# Patient Record
Sex: Female | Born: 1970 | Race: Black or African American | Hispanic: No | Marital: Married | Smoking: Current every day smoker
Health system: Southern US, Community
[De-identification: ages and names within clinical notes are randomized; demographics above are authoritative.]

## PROBLEM LIST (undated history)

## (undated) ENCOUNTER — Emergency Department (HOSPITAL_COMMUNITY): Payer: 59 | Source: Home / Self Care

## (undated) DIAGNOSIS — K449 Diaphragmatic hernia without obstruction or gangrene: Secondary | ICD-10-CM

## (undated) DIAGNOSIS — N390 Urinary tract infection, site not specified: Secondary | ICD-10-CM

## (undated) DIAGNOSIS — K219 Gastro-esophageal reflux disease without esophagitis: Secondary | ICD-10-CM

## (undated) DIAGNOSIS — I10 Essential (primary) hypertension: Secondary | ICD-10-CM

## (undated) HISTORY — DX: Diaphragmatic hernia without obstruction or gangrene: K44.9

## (undated) HISTORY — PX: SHOULDER ARTHROSCOPY WITH ROTATOR CUFF REPAIR: SHX5685

## (undated) HISTORY — DX: Gastro-esophageal reflux disease without esophagitis: K21.9

## (undated) HISTORY — PX: CHOLECYSTECTOMY: SHX55

## (undated) HISTORY — PX: ABDOMINAL HYSTERECTOMY: SHX81

---

## 2003-08-23 ENCOUNTER — Other Ambulatory Visit: Payer: Self-pay

## 2003-12-21 ENCOUNTER — Emergency Department: Payer: Self-pay | Admitting: Emergency Medicine

## 2004-01-20 ENCOUNTER — Emergency Department: Payer: Self-pay | Admitting: Unknown Physician Specialty

## 2004-03-17 ENCOUNTER — Emergency Department: Payer: Self-pay | Admitting: Emergency Medicine

## 2004-05-13 ENCOUNTER — Emergency Department: Payer: Self-pay | Admitting: Emergency Medicine

## 2004-06-14 ENCOUNTER — Emergency Department: Payer: Self-pay | Admitting: Emergency Medicine

## 2004-07-08 ENCOUNTER — Emergency Department: Payer: Self-pay | Admitting: Emergency Medicine

## 2004-12-08 ENCOUNTER — Emergency Department: Payer: Self-pay | Admitting: Internal Medicine

## 2005-02-24 ENCOUNTER — Emergency Department: Payer: Self-pay | Admitting: Emergency Medicine

## 2005-05-12 ENCOUNTER — Emergency Department: Payer: Self-pay | Admitting: General Practice

## 2005-06-05 ENCOUNTER — Emergency Department: Payer: Self-pay | Admitting: Emergency Medicine

## 2006-02-09 ENCOUNTER — Other Ambulatory Visit: Admission: RE | Admit: 2006-02-09 | Discharge: 2006-02-09 | Payer: Self-pay | Admitting: Obstetrics and Gynecology

## 2006-07-30 ENCOUNTER — Emergency Department: Payer: Self-pay | Admitting: Emergency Medicine

## 2006-09-25 ENCOUNTER — Emergency Department: Payer: Self-pay | Admitting: Emergency Medicine

## 2006-11-20 ENCOUNTER — Emergency Department: Payer: Self-pay | Admitting: Emergency Medicine

## 2008-02-12 ENCOUNTER — Emergency Department: Payer: Self-pay | Admitting: Emergency Medicine

## 2009-11-05 ENCOUNTER — Emergency Department: Payer: Self-pay | Admitting: Unknown Physician Specialty

## 2009-12-15 ENCOUNTER — Emergency Department: Payer: Self-pay | Admitting: Emergency Medicine

## 2010-08-13 ENCOUNTER — Emergency Department: Payer: Self-pay | Admitting: *Deleted

## 2010-09-26 ENCOUNTER — Ambulatory Visit: Payer: Self-pay

## 2011-09-22 ENCOUNTER — Emergency Department (HOSPITAL_COMMUNITY)
Admission: EM | Admit: 2011-09-22 | Discharge: 2011-09-23 | Disposition: A | Discharge status: Home / Self Care | Payer: Managed Care, Other (non HMO) | Attending: Emergency Medicine | Admitting: Emergency Medicine

## 2011-09-22 ENCOUNTER — Emergency Department (HOSPITAL_COMMUNITY): Payer: Managed Care, Other (non HMO)

## 2011-09-22 ENCOUNTER — Encounter (HOSPITAL_COMMUNITY): Payer: Self-pay | Admitting: *Deleted

## 2011-09-22 DIAGNOSIS — M549 Dorsalgia, unspecified: Secondary | ICD-10-CM | POA: Insufficient documentation

## 2011-09-22 DIAGNOSIS — R109 Unspecified abdominal pain: Secondary | ICD-10-CM | POA: Insufficient documentation

## 2011-09-22 DIAGNOSIS — F17200 Nicotine dependence, unspecified, uncomplicated: Secondary | ICD-10-CM | POA: Insufficient documentation

## 2011-09-22 DIAGNOSIS — I10 Essential (primary) hypertension: Secondary | ICD-10-CM | POA: Insufficient documentation

## 2011-09-22 HISTORY — DX: Urinary tract infection, site not specified: N39.0

## 2011-09-22 HISTORY — DX: Essential (primary) hypertension: I10

## 2011-09-22 LAB — POCT I-STAT, CHEM 8
BUN: 5 mg/dL — ABNORMAL LOW (ref 6–23)
Creatinine, Ser: 0.8 mg/dL (ref 0.50–1.10)
Hemoglobin: 15.3 g/dL — ABNORMAL HIGH (ref 12.0–15.0)
Potassium: 3.9 mEq/L (ref 3.5–5.1)
Sodium: 140 mEq/L (ref 135–145)

## 2011-09-22 LAB — CBC WITH DIFFERENTIAL/PLATELET
Basophils Absolute: 0.1 10*3/uL (ref 0.0–0.1)
Eosinophils Absolute: 0.3 10*3/uL (ref 0.0–0.7)
Lymphocytes Relative: 51 % — ABNORMAL HIGH (ref 12–46)
Lymphs Abs: 3.9 10*3/uL (ref 0.7–4.0)
Neutrophils Relative %: 39 % — ABNORMAL LOW (ref 43–77)
Platelets: 286 10*3/uL (ref 150–400)
RBC: 4.77 MIL/uL (ref 3.87–5.11)
WBC: 7.5 10*3/uL (ref 4.0–10.5)

## 2011-09-22 LAB — URINALYSIS, ROUTINE W REFLEX MICROSCOPIC
Nitrite: NEGATIVE
Specific Gravity, Urine: 1.01 (ref 1.005–1.030)
Urobilinogen, UA: 0.2 mg/dL (ref 0.0–1.0)

## 2011-09-22 MED ORDER — HYDROMORPHONE HCL PF 1 MG/ML IJ SOLN
1.0000 mg | Freq: Once | INTRAMUSCULAR | Status: AC
  Administered 2011-09-23: 1 mg via INTRAVENOUS
  Filled 2011-09-22: qty 1

## 2011-09-22 NOTE — ED Notes (Signed)
Attempted IV x 1. Unsuccessful. Another RN will look for IV. Will continue to monitor.

## 2011-09-22 NOTE — ED Provider Notes (Signed)
History     CSN: [id number]  Arrival date & time 09/22/11  1951   First MD Initiated Contact with Patient 09/22/11 2335      Chief Complaint  Patient presents with  . Back Pain    (Consider location/radiation/quality/duration/timing/severity/associated sxs/prior treatment) HPI Complains of low abdominal pain radiating to low back onset 2 weeks ago accompanied by dysuria. Pain worse with movement and urination. Improved with remaining still No other associated symptoms. Treated with 2 different antibiotics, without relief. Also treated with hydrocodone without relief. Pain is constant moderate to severe. No associated anorexia no fever no nausea or vomiting last bowel movement 3 days ago normal. Reports she had blood in urine at the beginning of illness. However no longer has hematuria Past Medical History  Diagnosis Date  . Hypertension   . Urinary tract infection     Past Surgical History  Procedure Date  . Abdominal hysterectomy   . Cholecystectomy     No family history on file.  History  Substance Use Topics  . Smoking status: Current Everyday Smoker -- 0.5 packs/day  . Smokeless tobacco: Not on file  . Alcohol Use: No    OB History    Grav Para Term Preterm Abortions TAB SAB Ect Mult Living                  Review of Systems  Constitutional: Negative.   HENT: Negative.   Respiratory: Negative.   Cardiovascular: Negative.   Gastrointestinal: Positive for abdominal pain.  Genitourinary: Positive for dysuria and pelvic pain.  Musculoskeletal: Positive for back pain.  Skin: Negative.   Neurological: Negative.   Hematological: Negative.   Psychiatric/Behavioral: Negative.   All other systems reviewed and are negative.    Allergies  Review of patient's allergies indicates not on file.  Home Medications  No current outpatient prescriptions on file.  BP 145/104  Pulse 68  Temp 98 F (36.7 C) (Oral)  Resp 20  SpO2 98%  Physical Exam  Nursing note  and vitals reviewed. Constitutional: She appears well-developed and well-nourished.       Appears uncomfortable Glasgow Coma Score 15  HENT:  Head: Normocephalic and atraumatic.  Eyes: Conjunctivae are normal. Pupils are equal, round, and reactive to light.  Neck: Neck supple. No tracheal deviation present. No thyromegaly present.  Cardiovascular: Normal rate and regular rhythm.   No murmur heard. Pulmonary/Chest: Effort normal and breath sounds normal.  Abdominal: Soft. Bowel sounds are normal. She exhibits no distension. There is tenderness.       Tender at infraumbilical area, right lower quadrant and left lower quadrant  Musculoskeletal: Normal range of motion. She exhibits tenderness. She exhibits no edema.       Bilateral paralumbar tenderness  Neurological: She is alert. She has normal reflexes. No cranial nerve deficit. Coordination normal.  Skin: Skin is warm and dry. No rash noted.  Psychiatric: She has a normal mood and affect.    ED Course  Procedures (including critical care time)  Labs Reviewed  CBC WITH DIFFERENTIAL - Abnormal; Notable for the following:    Neutrophils Relative 39 (*)     Lymphocytes Relative 51 (*)     All other components within normal limits  POCT I-STAT, CHEM 8 - Abnormal; Notable for the following:    BUN 5 (*)     Hemoglobin 15.3 (*)     All other components within normal limits  URINALYSIS, ROUTINE W REFLEX MICROSCOPIC   No results found. Results for  orders placed during the hospital encounter of 09/22/11  URINALYSIS, ROUTINE W REFLEX MICROSCOPIC      Component Value Range   Color, Urine YELLOW  YELLOW   APPearance CLEAR  CLEAR   Specific Gravity, Urine 1.010  1.005 - 1.030   pH 6.5  5.0 - 8.0   Glucose, UA NEGATIVE  NEGATIVE mg/dL   Hgb urine dipstick NEGATIVE  NEGATIVE   Bilirubin Urine NEGATIVE  NEGATIVE   Ketones, ur NEGATIVE  NEGATIVE mg/dL   Protein, ur NEGATIVE  NEGATIVE mg/dL   Urobilinogen, UA 0.2  0.0 - 1.0 mg/dL    Nitrite NEGATIVE  NEGATIVE   Leukocytes, UA NEGATIVE  NEGATIVE  CBC WITH DIFFERENTIAL      Component Value Range   WBC 7.5  4.0 - 10.5 K/uL   RBC 4.77  3.87 - 5.11 MIL/uL   Hemoglobin 14.2  12.0 - 15.0 g/dL   HCT 16.1  09.6 - 04.5 %   MCV 85.5  78.0 - 100.0 fL   MCH 29.8  26.0 - 34.0 pg   MCHC 34.8  30.0 - 36.0 g/dL   RDW 40.9  81.1 - 91.4 %   Platelets 286  150 - 400 K/uL   Neutrophils Relative 39 (*) 43 - 77 %   Neutro Abs 2.9  1.7 - 7.7 K/uL   Lymphocytes Relative 51 (*) 12 - 46 %   Lymphs Abs 3.9  0.7 - 4.0 K/uL   Monocytes Relative 5  3 - 12 %   Monocytes Absolute 0.4  0.1 - 1.0 K/uL   Eosinophils Relative 5  0 - 5 %   Eosinophils Absolute 0.3  0.0 - 0.7 K/uL   Basophils Relative 1  0 - 1 %   Basophils Absolute 0.1  0.0 - 0.1 K/uL  POCT I-STAT, CHEM 8      Component Value Range   Sodium 140  135 - 145 mEq/L   Potassium 3.9  3.5 - 5.1 mEq/L   Chloride 103  96 - 112 mEq/L   BUN 5 (*) 6 - 23 mg/dL   Creatinine, Ser 7.82  0.50 - 1.10 mg/dL   Glucose, Bld 76  70 - 99 mg/dL   Calcium, Ion 9.56  2.13 - 1.32 mmol/L   TCO2 26  0 - 100 mmol/L   Hemoglobin 15.3 (*) 12.0 - 15.0 g/dL   HCT 08.6  57.8 - 46.9 %   No results found.   No diagnosis found.   2 10 AM feels much improved after treatment with intravenous dilaudid. Patient alert ambulatory. Patient advised to take her blood pressure medicine as prescribed. She has not taken her medicine today MDM  Pain felt to be nonspecific Patient is told that she can take 2 of your hydrocodone tablets every 4 hours as needed for pain she is encouraged to followup with her primary care physician in [place]. Diagnosis #1 nonspecific back pain Diagnosis #2 nonspecific abdominal pain Diagnosis number.        Doug Sou, MD 09/23/11 [phone]

## 2011-09-22 NOTE — ED Notes (Signed)
Pt stated that she has a bad UTI 2 weeks ago. She went to Urgent Care and they gave her PO medications.  She stated she was feeling better until Sunday. She begin having RL back pain. Pain is radiating to R lower abdomen. No urine frequency, itching, or pain. Pt stated no n/v. No blood in urine. Will continue to monitor.

## 2011-09-22 NOTE — ED Notes (Signed)
C/o back pain, onset last week, seen by PCP last week, had blood in urine, "could have kidney stones", was set up for outpt CT this coming Friday, given pyridium, macrodantin & hydrocodone. "here d/t pain worse", pinpoints pain to diffuse low back. Sharp shooting aching. No meds PTA.

## 2011-09-23 MED ORDER — IOHEXOL 300 MG/ML  SOLN: 20.0000 mL | INTRAMUSCULAR | Status: DC

## 2011-09-23 NOTE — ED Notes (Signed)
Pt stated that she does not need CD with CT any longer. Will continue to monitor.

## 2011-09-23 NOTE — ED Notes (Signed)
Dr. Ethelda Chick made aware of vitals

## 2012-03-11 ENCOUNTER — Ambulatory Visit: Payer: Self-pay

## 2012-03-22 ENCOUNTER — Emergency Department: Payer: Self-pay | Admitting: Emergency Medicine

## 2012-03-22 LAB — CBC
HGB: 14.4 g/dL (ref 12.0–16.0)
MCH: 30.8 pg (ref 26.0–34.0)
MCHC: 33.9 g/dL (ref 32.0–36.0)
MCV: 91 fL (ref 80–100)
Platelet: 225 10*3/uL (ref 150–440)
RBC: 4.66 10*6/uL (ref 3.80–5.20)

## 2012-03-22 LAB — COMPREHENSIVE METABOLIC PANEL
Anion Gap: 8 (ref 7–16)
Calcium, Total: 8.9 mg/dL (ref 8.5–10.1)
Chloride: 102 mmol/L (ref 98–107)
Co2: 28 mmol/L (ref 21–32)
EGFR (African American): >60
EGFR (Non-African Amer.): >60
SGOT(AST): 19 U/L (ref 15–37)
SGPT (ALT): 17 U/L (ref 12–78)
Total Protein: 7.8 g/dL (ref 6.4–8.2)

## 2012-03-22 LAB — CK TOTAL AND CKMB (NOT AT ARMC)
CK, Total: 226 U/L — ABNORMAL HIGH (ref 21–215)
CK-MB: 1.5 ng/mL (ref 0.5–3.6)

## 2012-03-22 LAB — TROPONIN I: Troponin-I: <0.02 ng/mL

## 2012-08-01 ENCOUNTER — Ambulatory Visit: Payer: Self-pay

## 2013-05-19 ENCOUNTER — Ambulatory Visit: Payer: Self-pay

## 2013-08-03 ENCOUNTER — Ambulatory Visit: Payer: Self-pay | Admitting: Specialist

## 2013-09-22 ENCOUNTER — Ambulatory Visit: Payer: Self-pay | Admitting: Internal Medicine

## 2013-09-24 ENCOUNTER — Emergency Department (HOSPITAL_COMMUNITY)
Admission: EM | Admit: 2013-09-24 | Discharge: 2013-09-24 | Disposition: A | Discharge status: Home / Self Care | Payer: Managed Care, Other (non HMO) | Attending: Emergency Medicine | Admitting: Emergency Medicine

## 2013-09-24 ENCOUNTER — Encounter (HOSPITAL_COMMUNITY): Payer: Self-pay | Admitting: Emergency Medicine

## 2013-09-24 DIAGNOSIS — Z87440 Personal history of urinary (tract) infections: Secondary | ICD-10-CM | POA: Insufficient documentation

## 2013-09-24 DIAGNOSIS — I10 Essential (primary) hypertension: Secondary | ICD-10-CM | POA: Insufficient documentation

## 2013-09-24 DIAGNOSIS — F17200 Nicotine dependence, unspecified, uncomplicated: Secondary | ICD-10-CM | POA: Insufficient documentation

## 2013-09-24 DIAGNOSIS — H9201 Otalgia, right ear: Secondary | ICD-10-CM

## 2013-09-24 DIAGNOSIS — Z79899 Other long term (current) drug therapy: Secondary | ICD-10-CM | POA: Insufficient documentation

## 2013-09-24 DIAGNOSIS — R42 Dizziness and giddiness: Secondary | ICD-10-CM | POA: Insufficient documentation

## 2013-09-24 DIAGNOSIS — H9209 Otalgia, unspecified ear: Secondary | ICD-10-CM | POA: Insufficient documentation

## 2013-09-24 MED ORDER — OXYCODONE-ACETAMINOPHEN 5-325 MG PO TABS
1.0000 | ORAL_TABLET | Freq: Once | ORAL | Status: AC
  Administered 2013-09-24: 1 via ORAL
  Filled 2013-09-24: qty 1

## 2013-09-24 MED ORDER — IBUPROFEN 800 MG PO TABS: 800.0000 mg | ORAL_TABLET | Freq: Three times a day (TID) | ORAL | Status: DC

## 2013-09-24 MED ORDER — ANTIPYRINE-BENZOCAINE 5.4-1.4 % OT SOLN
3.0000 [drp] | Freq: Once | OTIC | Status: AC
  Administered 2013-09-24: 4 [drp] via OTIC
  Filled 2013-09-24: qty 10

## 2013-09-24 MED ORDER — IBUPROFEN 400 MG PO TABS
800.0000 mg | ORAL_TABLET | Freq: Once | ORAL | Status: AC
  Administered 2013-09-24: 800 mg via ORAL
  Filled 2013-09-24: qty 2

## 2013-09-24 MED ORDER — OXYCODONE-ACETAMINOPHEN 5-325 MG PO TABS: 1.0000 | ORAL_TABLET | ORAL | Status: DC | PRN

## 2013-09-24 NOTE — ED Notes (Signed)
Declined W/C at D/C and was escorted to lobby by RN. 

## 2013-09-24 NOTE — ED Provider Notes (Signed)
CSN: [id number]     Arrival date & time 09/24/13  1524 History   This chart was scribed for non-physician practitioner Jillyn LedgerJessica K Anasophia Pecor, PA-C,  working with Gerhard Munchobert Lockwood, MD, by Yevette EdwardsAngela Bracken, ED Scribe. This patient was seen in room TR04C/TR04C and the patient's care was started at 5:23 PM. First MD Initiated Contact with Patient 09/24/13 1649     Chief Complaint  Patient presents with  . Otalgia    Right ear    The history is provided by the patient. No language interpreter was used.   HPI Comments: Carol Best is a 43 y.o. female, with a h/o HTN who presents to the Emergency Department complaining of ear pain. Pain is bilateral with the right more than the left (R>L). Patient states she has had this pain for over a month, but it is not improving. She denies any acute changes in her pain but is "tired of it." She has been to her PCP three times PTA today and was placed on Levaquin and Augmentin. She was told she had an OM and OE infection as well as a sinus infection. She is currently on Augmentin and Cipro drops. She also was given prednisone IM and is taking a prednisone dose pack. Patient is worried that her symptoms are not improving despite being on antibiotics. She has an appointment with an ENT specialist this week. Patient complains of bilateral ear pressure and pain. She also endorses sinus pressure and intermittent dizziness. No dizziness currently. Took Motrin PTA with no relief. No hearing loss, tinnitus, fever, chills, rhinorrhea, sore throat, headache, neck pain, eye pain, or other concerns.    Past Medical History  Diagnosis Date  . Hypertension   . Urinary tract infection    Past Surgical History  Procedure Laterality Date  . Abdominal hysterectomy    . Cholecystectomy     No family history on file. History  Substance Use Topics  . Smoking status: Current Every Day Smoker -- 0.50 packs/day  . Smokeless tobacco: Not on file  . Alcohol Use: No   No OB history  provided.  Review of Systems  Constitutional: Negative for fever, chills, diaphoresis, activity change, appetite change and fatigue.  HENT: Positive for ear pain and sinus pressure. Negative for congestion, ear discharge, hearing loss, rhinorrhea and sore throat.   Eyes: Negative for photophobia, pain and visual disturbance.  Respiratory: Negative for cough.   Gastrointestinal: Negative for nausea and vomiting.  Musculoskeletal: Negative for myalgias and neck pain.  Neurological: Positive for dizziness. Negative for weakness, light-headedness, numbness and headaches.    Allergies  Review of patient's allergies indicates no known allergies.  Home Medications   Prior to Admission medications   Medication Sig Start Date End Date Taking? Authorizing Provider  atenolol (TENORMIN) 25 MG tablet Take 25 mg by mouth daily.    Historical Provider, MD  hydrochlorothiazide (HYDRODIURIL) 25 MG tablet Take 25 mg by mouth daily.    Historical Provider, MD  HYDROcodone-acetaminophen (NORCO) 5-325 MG per tablet Take 1 tablet by mouth every 6 (six) hours as needed. bright    Historical Provider, MD  phenazopyridine (PYRIDIUM) 200 MG tablet Take 200 mg by mouth once.    Historical Provider, MD   Triage Vitals: BP 149/95  Pulse 94  Temp(Src) 98.1 F (36.7 C) (Oral)  Resp 18  Wt 168 lb (76.204 kg)  SpO2 99%  Filed Vitals:   09/24/13 1529 09/24/13 1848  BP: 149/95 140/101  Pulse: 94 64  Temp:  98.1 F (36.7 C)   TempSrc: Oral   Resp: 18 18  Weight: 168 lb (76.204 kg)   SpO2: 99% 100%    Physical Exam  Nursing note and vitals reviewed. Constitutional: She is oriented to person, place, and time. She appears well-developed and well-nourished. No distress.  Non-toxic  HENT:  Head: Normocephalic and atraumatic.  Right Ear: External ear normal.  Left Ear: External ear normal.  Nose: Nose normal.  Mouth/Throat: Oropharynx is clear and moist. No oropharyngeal exudate.  Tympanic membranes gray  and translucent bilaterally with no erythema, edema, or hemotympanum. Right TM is slightly more opaque compared to the left. No TM bulging bilaterally. No mastoid or tragal tenderness bilaterally. No ear drainage or erythema in the EAC. Mild diffuse maxillary sinus tenderness bilaterally. No erythema to the posterior pharynx. Tonsils without edema or exudates. Uvula midline. No trismus. No difficulty controlling secretions.   Eyes: Conjunctivae and EOM are normal. Pupils are equal, round, and reactive to light. Right eye exhibits no discharge. Left eye exhibits no discharge.  Neck: Normal range of motion. Neck supple.  No cervical lymphadenopathy. No nuchal rigidity. No neck masses or edema.   Cardiovascular: Normal rate, regular rhythm and normal heart sounds.  Exam reveals no gallop and no friction rub.   No murmur heard. Pulmonary/Chest: Effort normal and breath sounds normal. No respiratory distress. She has no wheezes. She has no rales. She exhibits no tenderness.  Abdominal: Soft.  Musculoskeletal: Normal range of motion. She exhibits no edema and no tenderness.  Patient able to ambulate without difficulty or ataxia  Neurological: She is alert and oriented to person, place, and time.  Skin: Skin is warm and dry. She is not diaphoretic.     ED Course  Procedures (including critical care time)  DIAGNOSTIC STUDIES: Oxygen Saturation is 99% on room air, normal by my interpretation.    COORDINATION OF CARE:  5:28 PM- Discussed treatment plan with patient, and the patient agreed to the plan. The plan includes analgesic ear drops.    6:10 PM- Rechecked pt. Pt stated she has been taking Levaquin for over a month, since June 1st, for recurrent ear infections.   6:34 PM- Recheck pt. Offered the option for CT scan, and the pt declined. She states she will call ENT tomorrow to ask for an earlier appointment. She also states that her pain has not worsened, instead she wants resolution of the  pain.   Labs Review Labs Reviewed - No data to display  Imaging Review No results found.   EKG Interpretation None      MDM   Carol Best is a 43 y.o. female is a 43 y.o. female, with a h/o HTN who presents to the Emergency Department complaining of ear pain. Etiology of ear pain unclear. Patient treated with Levaquin, prednisone, Ciprodex, and Augmentin PTA by her PCP for OM, OE and sinusitis. Has an appointment with an ENT specialist this week. Patient denies any acute worsening of her condition, but comes to the ED today due to lack of improvements despite antibiotics. Offered patient CT scan to further evaluate, however, patient refused. Would like to follow-up with ENT this week. No evidence of mastoiditis, OM, or OE at this time. Patient afebrile and non-toxic in appearance. Patient had improvements in her pain with Auralgan, Ibuprofen and Percocet throughout her ED visit. Vital signs stable. Advised patient to continue antibiotics. Return precautions, discharge instructions, and follow-up was discussed with the patient before discharge.  Discharge Medication List as of 09/24/2013  6:36 PM    START taking these medications   Details  ibuprofen (ADVIL,MOTRIN) 800 MG tablet Take 1 tablet (800 mg total) by mouth 3 (three) times daily., Starting 09/24/2013, Until Discontinued, Print    oxyCODONE-acetaminophen (PERCOCET/ROXICET) 5-325 MG per tablet Take 1-2 tablets by mouth every 4 (four) hours as needed for severe pain., Starting 09/24/2013, Until Discontinued, Print         Final impressions: 1. Otalgia, right       Luiz Iron PA-C          Jillyn Ledger, PA-C 09/25/13 1356

## 2013-09-24 NOTE — ED Notes (Addendum)
Pt c/o pain to right ear onset Monday. Pt has been seen by PMD multiple times for same, told she has a sinus infection along with outer and inner ear infection and given prescriptions medications without relief. Pt took 2 motrin PTA.

## 2013-09-24 NOTE — Discharge Instructions (Signed)
Take Oxycodone for severe pain - Please be careful with this medication.  It can cause drowsiness.  Use caution while driving, operating machinery, drinking alcohol, or any other activities that may impair your physical or mental abilities.   Take Ibuprofen for mild-moderate pain - take with food  Continue to take your oral antibiotics and use ear drops  Use Auralgan drops for pain control 3 times a day (3-4 drops) for 2-3 days  Return to the emergency department if you develop any changing/worsening condition, fever, difficulty swallowing/breathing, severe headaches or any other concerns (please read additional information regarding your condition below)    Otalgia The most common reason for this in children is an infection of the middle ear. Pain from the middle ear is usually caused by a build-up of fluid and pressure behind the eardrum. Pain from an earache can be sharp, dull, or burning. The pain may be temporary or constant. The middle ear is connected to the nasal passages by a short narrow tube called the Eustachian tube. The Eustachian tube allows fluid to drain out of the middle ear, and helps keep the pressure in your ear equalized. CAUSES  A cold or allergy can block the Eustachian tube with inflammation and the build-up of secretions. This is especially likely in small children, because their Eustachian tube is shorter and more horizontal. When the Eustachian tube closes, the normal flow of fluid from the middle ear is stopped. Fluid can accumulate and cause stuffiness, pain, hearing loss, and an ear infection if germs start growing in this area. SYMPTOMS  The symptoms of an ear infection may include fever, ear pain, fussiness, increased crying, and irritability. Many children will have temporary and minor hearing loss during and right after an ear infection. Permanent hearing loss is rare, but the risk increases the more infections a child has. Other causes of ear pain include retained  water in the outer ear canal from swimming and bathing. Ear pain in adults is less likely to be from an ear infection. Ear pain may be referred from other locations. Referred pain may be from the joint between your jaw and the skull. It may also come from a tooth problem or problems in the neck. Other causes of ear pain include:  A foreign body in the ear.  Outer ear infection.  Sinus infections.  Impacted ear wax.  Ear injury.  Arthritis of the jaw or TMJ problems.  Middle ear infection.  Tooth infections.  Sore throat with pain to the ears. DIAGNOSIS  Your caregiver can usually make the diagnosis by examining you. Sometimes other special studies, including x-rays and lab work may be necessary. TREATMENT   If antibiotics were prescribed, use them as directed and finish them even if you or your child's symptoms seem to be improved.  Sometimes PE tubes are needed in children. These are little plastic tubes which are put into the eardrum during a simple surgical procedure. They allow fluid to drain easier and allow the pressure in the middle ear to equalize. This helps relieve the ear pain caused by pressure changes. HOME CARE INSTRUCTIONS   Only take over-the-counter or prescription medicines for pain, discomfort, or fever as directed by your caregiver. DO NOT GIVE CHILDREN ASPIRIN because of the association of Reye's Syndrome in children taking aspirin.  Use a cold pack applied to the outer ear for 15-20 minutes, 03-04 times per day or as needed may reduce pain. Do not apply ice directly to the skin. You  may cause frost bite.  Over-the-counter ear drops used as directed may be effective. Your caregiver may sometimes prescribe ear drops.  Resting in an upright position may help reduce pressure in the middle ear and relieve pain.  Ear pain caused by rapidly descending from high altitudes can be relieved by swallowing or chewing gum. Allowing infants to suck on a bottle during  airplane travel can help.  Do not smoke in the house or near children. If you are unable to quit smoking, smoke outside.  Control allergies. SEEK IMMEDIATE MEDICAL CARE IF:   You or your child are becoming sicker.  Pain or fever relief is not obtained with medicine.  You or your child's symptoms (pain, fever, or irritability) do not improve within 24 to 48 hours or as instructed.  Severe pain suddenly stops hurting. This may indicate a ruptured eardrum.  You or your children develop new problems such as severe headaches, stiff neck, difficulty swallowing, or swelling of the face or around the ear. Document Released: 10/25/2003 Document Revised: 06/01/2011 Document Reviewed: 02/29/2008 [facility] Patient Information 2015 [employer], [place]. This information is not intended to replace advice given to you by your health care provider. Make sure you discuss any questions you have with your health care provider.

## 2013-09-25 ENCOUNTER — Telehealth (HOSPITAL_COMMUNITY): Payer: Self-pay | Admitting: Emergency Medicine

## 2013-09-28 NOTE — ED Provider Notes (Signed)
  Medical screening examination/treatment/procedure(s) were performed by non-physician practitioner and as supervising physician I was immediately available for consultation/collaboration.   EKG Interpretation None         Gerhard Munchobert Elva Breaker, MD 09/28/13 0710

## 2013-12-15 ENCOUNTER — Encounter (HOSPITAL_COMMUNITY): Payer: Self-pay | Admitting: Emergency Medicine

## 2013-12-15 ENCOUNTER — Emergency Department (HOSPITAL_COMMUNITY)
Admission: EM | Admit: 2013-12-15 | Discharge: 2013-12-15 | Disposition: A | Discharge status: Home / Self Care | Payer: Managed Care, Other (non HMO) | Attending: Emergency Medicine | Admitting: Emergency Medicine

## 2013-12-15 DIAGNOSIS — F17200 Nicotine dependence, unspecified, uncomplicated: Secondary | ICD-10-CM | POA: Insufficient documentation

## 2013-12-15 DIAGNOSIS — M25511 Pain in right shoulder: Secondary | ICD-10-CM

## 2013-12-15 DIAGNOSIS — M25519 Pain in unspecified shoulder: Secondary | ICD-10-CM | POA: Insufficient documentation

## 2013-12-15 DIAGNOSIS — I10 Essential (primary) hypertension: Secondary | ICD-10-CM | POA: Diagnosis not present

## 2013-12-15 DIAGNOSIS — Z79899 Other long term (current) drug therapy: Secondary | ICD-10-CM | POA: Insufficient documentation

## 2013-12-15 DIAGNOSIS — Z791 Long term (current) use of non-steroidal anti-inflammatories (NSAID): Secondary | ICD-10-CM | POA: Insufficient documentation

## 2013-12-15 DIAGNOSIS — Z87440 Personal history of urinary (tract) infections: Secondary | ICD-10-CM | POA: Insufficient documentation

## 2013-12-15 MED ORDER — HYDROCODONE-ACETAMINOPHEN 5-325 MG PO TABS: 1.0000 | ORAL_TABLET | Freq: Four times a day (QID) | ORAL | Status: DC | PRN

## 2013-12-15 NOTE — ED Notes (Signed)
The pt is c/o rt shoulder pain after she picked up a 2-liter drink and it fell out of her hand just pta.  She has a known rotator cuff tear of the rt shoulder

## 2013-12-15 NOTE — ED Provider Notes (Signed)
CSN: [id number]     Arrival date & time 12/15/13  2238 History  This chart was scribed for non-physician practitioner working with Flint Melter, MD by Elveria Rising, ED Scribe. This patient was seen in room TR06C/TR06C and the patient's care was started at 11:03 PM.   Chief Complaint  Patient presents with  . Shoulder Pain   HPI HPI Comments: Carol Best is a 43 y.o. female who presents to the Emergency Department with chief complaint of exacerbated right shoulder pain tonight. Increased pain presented after attempting to lift a 2-liter drink. Due to pain severity patient dropped the bottle. Patient shares history of complete right rotator cuff tear. MRI performed and her surgery scheduled. Her surgery was postponed due to lack of child care for her two small children. Patient is now waiting to her have the surgery completed so she may hopefully return to work. Patient has been treating her pain with ibuprofen, without significant relief and states that learned to cringe and bear the pain.    Past Medical History  Diagnosis Date  . Hypertension   . Urinary tract infection    Past Surgical History  Procedure Laterality Date  . Abdominal hysterectomy    . Cholecystectomy     No family history on file. History  Substance Use Topics  . Smoking status: Current Every Day Smoker -- 0.50 packs/day  . Smokeless tobacco: Not on file  . Alcohol Use: No   OB History   Grav Para Term Preterm Abortions TAB SAB Ect Mult Living                 Review of Systems  Constitutional: Negative for fever and chills.  Musculoskeletal: Positive for arthralgias.  Neurological: Negative for weakness and numbness.      Allergies  Review of patient's allergies indicates no known allergies.  Home Medications   Prior to Admission medications   Medication Sig Start Date End Date Taking? Authorizing Provider  atenolol (TENORMIN) 25 MG tablet Take 25 mg by mouth daily.    Historical Provider, MD   hydrochlorothiazide (HYDRODIURIL) 25 MG tablet Take 25 mg by mouth daily.    Historical Provider, MD  HYDROcodone-acetaminophen (NORCO) 5-325 MG per tablet Take 1 tablet by mouth every 6 (six) hours as needed. bright    Historical Provider, MD  ibuprofen (ADVIL,MOTRIN) 800 MG tablet Take 1 tablet (800 mg total) by mouth 3 (three) times daily. 09/24/13   Jillyn Ledger, PA-C  oxyCODONE-acetaminophen (PERCOCET/ROXICET) 5-325 MG per tablet Take 1-2 tablets by mouth every 4 (four) hours as needed for severe pain. 09/24/13   Jillyn Ledger, PA-C  phenazopyridine (PYRIDIUM) 200 MG tablet Take 200 mg by mouth once.    Historical Provider, MD   Triage Vitals: BP 136/94  Pulse 91  Temp(Src) 98.2 F (36.8 C) (Oral)  Resp 18  SpO2 97%  Physical Exam  Nursing note and vitals reviewed. Constitutional: She is oriented to person, place, and time. She appears well-developed and well-nourished. No distress.  HENT:  Head: Normocephalic and atraumatic.  Eyes: EOM are normal.  Neck: Neck supple.  Cardiovascular: Normal rate, regular rhythm and intact distal pulses.   Pulmonary/Chest: Effort normal. No respiratory distress.  Musculoskeletal: Normal range of motion.  Right shoulder: ROM and strength limited secondary to pain. Positive empty can test. No obvious swelling, bony abormality or deformity.   Neurological: She is alert and oriented to person, place, and time.  Sensation intact.  Skin: Skin is warm  and dry.  Psychiatric: She has a normal mood and affect. Her behavior is normal.    ED Course  Procedures (including critical care time)  COORDINATION OF CARE: 11:08 PM- Discussed treatment plan with patient at bedside and patient agreed to plan.   Labs Review Labs Reviewed - No data to display  Imaging Review No results found.   EKG Interpretation None      MDM   Final diagnoses:  Right shoulder pain    Patient with right shoulder pain.  Known RTC tear.  No new injury.  No bony  tenderness.  Will sling patient and give some pain meds.  Recommend ortho follow-up as planned for surgery.  I personally performed the services described in this documentation, which was scribed in my presence. The recorded information has been reviewed and is accurate.    Roxy Horseman, PA-C 12/15/13 2316

## 2013-12-15 NOTE — ED Provider Notes (Signed)
Medical screening examination/treatment/procedure(s) were performed by non-physician practitioner and as supervising physician I was immediately available for consultation/collaboration.  Cecille Mcclusky L Ethelwyn Gilbertson, MD 12/15/13 [id number] 

## 2013-12-15 NOTE — Discharge Instructions (Signed)
Surgery for Rotator Cuff Tear with Rehab Rotator cuff surgery is only recommended for individuals who have experienced persistent disability for greater than 3 months of non-surgical (conservative) treatment. Surgery is not necessary but is recommended for individuals who experience difficulty completing daily activities or athletes who are unable to compete. Rotator cuff tears do not usually heal without surgical intervention. If left alone small rotator cuff tears usually become larger. Younger athletes who have a rotator cuff tear may be recommended for surgery without attempting conservative rehabilitation. The purpose of surgery is to regain function of the shoulder joint and eliminate pain associated with the injury. In addition to repairing the tendon tear, the surgery often removes a portion of the bony roof of the shoulder (acromion) as well as the chronically thickened and inflamed membrane below the acromion (subacromial bursa). REASONS NOT TO OPERATE   Infection of the shoulder.  Inability to complete a rehabilitation program.  Patients who have other conditions (emotional or psychological) conditions that contribute to their shoulder condition. RISKS AND COMPLICATIONS  Infection.  Re-tear of the rotator cuff tendons or muscles.  Shoulder stiffness and/or weakness.  Inability to compete in athletics.  Acromioclavicular (AC) joint paint.  Risks of surgery: infection, bleeding, nerve damage, or damage to surrounding tissues. TECHNIQUE There are different surgical procedures used to treat rotator cuff tears. The type of procedure depends on the extent of injury as well as the surgeon's preference. All of the surgical techniques for rotator cuff tears have the same goal of repairing the torn tendon, removing part of the acromion, and removing the subacromial bursa. There are two main types of procedures: arthroscopic and open incision. Arthroscopic procedures are usually completed  and you go home the same day as surgery (outpatient). These procedures use multiple small incisions in which tools and a video camera are placed to work on the shoulder. An electric shaver removes the bursa, then a power burr shaves down the portion of the acromion that places pressure on the rotator cuff. Finally the rotator cuff is sewed (sutured) back to the humeral head. Open incision procedures require a larger incision. The deltoid muscle is detached from the acromion and a ligament in the shoulder (coracoacromial) is cut in order for the surgeon to access the rotator cuff. The subacromial bursa is removed as well as part of the acromion to give the rotator cuff room to move freely. The torn tendon is then sutured to the humeral head. After the rotator cuff is repaired, then the deltoid is reattached and the incision is closed up.  RECOVERY   Post-operative care depends on the surgical technique and the preferences of your therapist.  Keep the wound clean and dry for the first 10 to 14 days after surgery.  Keep your shoulder and arm in the sling provided to you for as long as you have been instructed to.  You will be given pain medications by your caregiver.  Passive (without using muscles) shoulder movements may be begun immediately after surgery.  It is important to follow through with you rehabilitation program in order to have the best possible recovery. RETURN TO SPORTS   The rehabilitation period will depend on the sport and position you play as well as the success of the operation.  The minimum recovery period is 6 months.  You must have regained complete shoulder motion and strength before returning to sports. SEEK IMMEDIATE MEDICAL CARE IF:   Any medications produce adverse side effects.  Any complications from surgery  occur:  Pain, numbness, or coldness in the extremity operated upon.  Discoloration of the nail beds (they become blue or gray) of the extremity operated  upon.  Signs of infections (fever, pain, inflammation, redness, or persistent bleeding). EXERCISES  RANGE OF MOTION (ROM) AND STRETCHING EXERCISES - Rotator Cuff Tear, Surgery For These exercises may help you restore your elbow mobility once your physician has discontinued your immobilization period. Beginning these before your provider's approval may result in delayed healing. Your symptoms may resolve with or without further involvement from your physician, physical therapist or athletic trainer. While completing these exercises, remember:   Restoring tissue flexibility helps normal motion to return to the joints. This allows healthier, less painful movement and activity.  An effective stretch should be held for at least 30 seconds. A stretch should never be painful. You should only feel a gentle lengthening or release in the stretch. ROM - Pendulum   Bend at the waist so that your right / left arm falls away from your body. Support yourself with your opposite hand on a solid surface, such as a table or a countertop.  Your right / left arm should be perpendicular to the ground. If it is not perpendicular, you need to lean over farther. Relax the muscles in your right / left arm and shoulder as much as possible.  Gently sway your hips and trunk so they move your right / left arm without any use of your right / left shoulder muscles.  Progress your movements so that your right / left arm moves side to side, then forward and backward, and finally, both clockwise and counterclockwise.  Complete __________ repetitions in each direction. Many people use this exercise to relieve discomfort in their shoulder as well as to gain range of motion. Repeat __________ times. Complete this exercise __________ times per day. STRETCH - Flexion, Seated   Sit in a firm chair so that your right / left forearm can rest on a table or on a table or countertop. Your right / left elbow should rest below the height  of your shoulder so that your shoulder feels supported and not tense or uncomfortable.  Keeping your right / left shoulder relaxed, lean forward at your waist, allowing your right / left hand to slide forward. Bend forward until you feel a moderate stretch in your shoulder, but before you feel an increase in your pain.  Hold __________ seconds. Slowly return to your starting position. Repeat __________ times. Complete this exercise __________ times per day.  STRETCH - Flexion, Standing   Stand with good posture. With an underhand grip on your right / left and an overhand grip on the opposite hand, grasp a broomstick or cane so that your hands are a little more than shoulder-width apart.  Keeping your right / left elbow straight and shoulder muscles relaxed, push the stick with your opposite hand to raise your right / left arm in front of your body and then overhead. Raise your arm until you feel a stretch in your right / left shoulder, but before you have increased shoulder pain.  Try to avoid shrugging your right / left shoulder as your arm rises by keeping your shoulder blade tucked down and toward your mid-back spine. Hold __________ seconds.  Slowly return to the starting position. Repeat __________ times. Complete this exercise __________ times per day.  STRETCH - Abduction, Supine   Stand with good posture. With an underhand grip on your right / left and an  overhand grip on the opposite hand, grasp a broomstick or cane so that your hands are a little more than shoulder-width apart. °· Keeping your right / left elbow straight and shoulder muscles relaxed, push the stick with your opposite hand to raise your right / left arm out to the side of your body and then overhead. Raise your arm until you feel a stretch in your right / left shoulder, but before you have increased shoulder pain. °· Try to avoid shrugging your right / left shoulder as your arm rises by keeping your shoulder blade tucked  down and toward your mid-back spine. Hold __________ seconds. °· Slowly return to the starting position. °Repeat __________ times. Complete this exercise __________ times per day.  °ROM - Flexion, Active-Assisted °· Lie on your back. You may bend your knees for comfort. °· Grasp a broomstick or cane so your hands are about shoulder-width apart. Your right / left hand should grip the end of the stick/cane so that your hand is positioned "thumbs-up," as if you were about to shake hands. °· Using your healthy arm to lead, raise your right / left arm overhead until you feel a gentle stretch in your shoulder. Hold __________ seconds. °· Use the stick/cane to assist in returning your right / left arm to its starting position. °Repeat __________ times. Complete this exercise __________ times per day.  °STRETCH - External Rotation  °· Tuck a folded towel or small ball under your right / left upper arm. Grasp a broomstick or cane with an underhand grasp a little more than shoulder width apart. Bend your elbows to 90 degrees. °· Stand with good posture or sit in a chair without arms. °· Use your strong arm to push the stick across your body. Do not allow the towel or ball to fall. This will rotate your right / left arm away from your abdomen. Using the stick turn/rotate your hand and forearm away from your body. Hold __________ seconds. °Repeat __________ times. Complete this exercise __________ times per day.  °STRENGTHENING EXERCISES - Rotator Cuff Tear, Surgery For °These exercises may help you begin to restore your elbow strength in the initial stage of your rehabilitation. Your physician will determine when you begin these exercises depending on the severity of your injury and the integrity of your repaired tissues. Beginning these before your provider's approval may result in delayed healing. While completing these exercises, remember:  °· Muscles can gain both the endurance and the strength needed for everyday  activities through controlled exercises. °· Complete these exercises as instructed by your physician, physical therapist or athletic trainer. Progress the resistance and repetitions only as guided. °· You may experience muscle soreness or fatigue, but the pain or discomfort you are trying to eliminate should never worsen during these exercises. If this pain does worsen, stop and make certain you are following the directions exactly. If the pain is still present after adjustments, discontinue the exercise until you can discuss the trouble with your clinician. °STRENGTH - Shoulder Flexion, Isometric  °· With good posture and facing a wall, stand or sit about 4-6 inches away. °· Keeping your right / left elbow straight, gently press the top of your fist into the wall. Increase the pressure gradually until you are pressing as hard as you can without shrugging your shoulder or increasing any shoulder discomfort. °· Hold __________ seconds. Release the tension slowly. Relax your shoulder muscles completely before you do the next repetition. °Repeat __________ times. Complete this   exercise __________ times per day.  STRENGTH - Shoulder Abductors, Isometric   With good posture, stand or sit about 4-6 inches from a wall with your right / left side facing the wall.  Bend your right / left elbow. Gently press your right / left elbow into the wall. Increase the pressure gradually until you are pressing as hard as you can without shrugging your shoulder or increasing any shoulder discomfort.  Hold __________ seconds.  Release the tension slowly. Relax your shoulder muscles completely before you do the next repetition. Repeat __________ times. Complete this exercise __________ times per day.  STRENGTH - Internal Rotators, Isometric   Keep your right / left elbow at your side and bend it 90 degrees.  Step into a door frame so that the inside of your right / left wrist can press against the door frame without your  upper arm leaving your side.  Gently press your right / left wrist into the door frame as if you were trying to draw the palm of your hand to your abdomen. Gradually increase the tension until you are pressing as hard as you can without shrugging your shoulder or increasing any shoulder discomfort.  Hold __________ seconds.  Release the tension slowly. Relax your shoulder muscles completely before you do the next repetition. Repeat __________ times. Complete this exercise __________ times per day.  STRENGTH - External Rotators, Isometric   Keep your right / left elbow at your side and bend it 90 degrees.  Step into a door frame so that the outside of your right / left wrist can press against the door frame without your upper arm leaving your side.  Gently press your right / left wrist into the door frame as if you were trying to swing the back of your hand away from your abdomen. Gradually increase the tension until you are pressing as hard as you can without shrugging your shoulder or increasing any shoulder discomfort.  Hold __________ seconds.  Release the tension slowly. Relax your shoulder muscles completely before you do the next repetition. Repeat __________ times. Complete this exercise __________ times per day.  Document Released: 03/09/2005 Document Revised: 06/01/2011 Document Reviewed: 06/21/2008 [facility] Patient Information 2015 [place], [place]. This information is not intended to replace advice given to you by your health care provider. Make sure you discuss any questions you have with your health care provider.

## 2014-02-28 ENCOUNTER — Ambulatory Visit: Payer: Self-pay | Admitting: Emergency Medicine

## 2014-08-01 LAB — HM PAP SMEAR: HM PAP: NORMAL

## 2015-09-09 ENCOUNTER — Ambulatory Visit
Admission: EM | Admit: 2015-09-09 | Discharge: 2015-09-09 | Disposition: A | Discharge status: Home / Self Care | Payer: Managed Care, Other (non HMO) | Attending: Family Medicine | Admitting: Family Medicine

## 2015-09-09 ENCOUNTER — Encounter: Payer: Self-pay | Admitting: *Deleted

## 2015-09-09 ENCOUNTER — Ambulatory Visit (INDEPENDENT_AMBULATORY_CARE_PROVIDER_SITE_OTHER): Payer: Managed Care, Other (non HMO)

## 2015-09-09 DIAGNOSIS — K5900 Constipation, unspecified: Secondary | ICD-10-CM | POA: Diagnosis not present

## 2015-09-09 DIAGNOSIS — R300 Dysuria: Secondary | ICD-10-CM | POA: Diagnosis not present

## 2015-09-09 LAB — URINALYSIS COMPLETE WITH MICROSCOPIC (ARMC ONLY)
BACTERIA UA: NONE SEEN
BILIRUBIN URINE: NEGATIVE
Glucose, UA: NEGATIVE mg/dL
HGB URINE DIPSTICK: NEGATIVE
KETONES UR: NEGATIVE mg/dL
LEUKOCYTES UA: NEGATIVE
NITRITE: NEGATIVE
PH: 6.5 (ref 5.0–8.0)
Protein, ur: NEGATIVE mg/dL
SPECIFIC GRAVITY, URINE: 1.015 (ref 1.005–1.030)

## 2015-09-09 LAB — CBC WITH DIFFERENTIAL/PLATELET
BASOS PCT: 1 %
Basophils Absolute: 0.1 10*3/uL (ref 0–0.1)
EOS ABS: 0.2 10*3/uL (ref 0–0.7)
Eosinophils Relative: 4 %
HCT: 42.5 % (ref 35.0–47.0)
HEMOGLOBIN: 14.1 g/dL (ref 12.0–16.0)
Lymphocytes Relative: 34 %
Lymphs Abs: 1.9 10*3/uL (ref 1.0–3.6)
MCH: 29.5 pg (ref 26.0–34.0)
MCHC: 33.2 g/dL (ref 32.0–36.0)
MCV: 89 fL (ref 80.0–100.0)
Monocytes Absolute: 0.3 10*3/uL (ref 0.2–0.9)
Monocytes Relative: 5 %
NEUTROS PCT: 56 %
Neutro Abs: 3.3 10*3/uL (ref 1.4–6.5)
Platelets: 233 10*3/uL (ref 150–440)
RBC: 4.77 MIL/uL (ref 3.80–5.20)
RDW: 13.6 % (ref 11.5–14.5)
WBC: 5.8 10*3/uL (ref 3.6–11.0)

## 2015-09-09 LAB — BASIC METABOLIC PANEL
Anion gap: 6 (ref 5–15)
BUN: 7 mg/dL (ref 6–20)
CALCIUM: 9 mg/dL (ref 8.9–10.3)
CHLORIDE: 104 mmol/L (ref 101–111)
CO2: 28 mmol/L (ref 22–32)
CREATININE: 0.82 mg/dL (ref 0.44–1.00)
GFR calc non Af Amer: >60 mL/min (ref >60)
Glucose, Bld: 96 mg/dL (ref 65–99)
Potassium: 4.6 mmol/L (ref 3.5–5.1)
SODIUM: 138 mmol/L (ref 135–145)

## 2015-09-09 MED ORDER — DOCUSATE SODIUM 100 MG PO CAPS: 100.0000 mg | ORAL_CAPSULE | Freq: Two times a day (BID) | ORAL | Status: DC

## 2015-09-09 NOTE — Discharge Instructions (Signed)
Take medication as prescribed. Rest. Drink plenty of fluids.   Follow up with your primary care physician this week. Return to Urgent care or ER for unresolved or increased abdominal pain, fever, vomiting, diarrhea, new or worsening concerns.    Constipation, Adult Constipation is when a person has fewer than three bowel movements a week, has difficulty having a bowel movement, or has stools that are dry, hard, or larger than normal. As people grow older, constipation is more common. A low-fiber diet, not taking in enough fluids, and taking certain medicines may make constipation worse.  CAUSES   Certain medicines, such as antidepressants, pain medicine, iron supplements, antacids, and water pills.   Certain diseases, such as diabetes, irritable bowel syndrome (IBS), thyroid disease, or depression.   Not drinking enough water.   Not eating enough fiber-rich foods.   Stress or travel.   Lack of physical activity or exercise.   Ignoring the urge to have a bowel movement.   Using laxatives too much.  SIGNS AND SYMPTOMS   Having fewer than three bowel movements a week.   Straining to have a bowel movement.   Having stools that are hard, dry, or larger than normal.   Feeling full or bloated.   Pain in the lower abdomen.   Not feeling relief after having a bowel movement.  DIAGNOSIS  Your health care provider will take a medical history and perform a physical exam. Further testing may be done for severe constipation. Some tests may include:  A barium enema X-ray to examine your rectum, colon, and, sometimes, your small intestine.   A sigmoidoscopy to examine your lower colon.   A colonoscopy to examine your entire colon. TREATMENT  Treatment will depend on the severity of your constipation and what is causing it. Some dietary treatments include drinking more fluids and eating more fiber-rich foods. Lifestyle treatments may include regular exercise. If these  diet and lifestyle recommendations do not help, your health care provider may recommend taking over-the-counter laxative medicines to help you have bowel movements. Prescription medicines may be prescribed if over-the-counter medicines do not work.  HOME CARE INSTRUCTIONS   Eat foods that have a lot of fiber, such as fruits, vegetables, whole grains, and beans.  Limit foods high in fat and processed sugars, such as french fries, hamburgers, cookies, candies, and soda.   A fiber supplement may be added to your diet if you cannot get enough fiber from foods.   Drink enough fluids to keep your urine clear or pale yellow.   Exercise regularly or as directed by your health care provider.   Go to the restroom when you have the urge to go. Do not hold it.   Only take over-the-counter or prescription medicines as directed by your health care provider. Do not take other medicines for constipation without talking to your health care provider first.  SEEK IMMEDIATE MEDICAL CARE IF:   You have bright red blood in your stool.   Your constipation lasts for more than 4 days or gets worse.   You have abdominal or rectal pain.   You have thin, pencil-like stools.   You have unexplained weight loss. MAKE SURE YOU:   Understand these instructions.  Will watch your condition.  Will get help right away if you are not doing well or get worse.   This information is not intended to replace advice given to you by your health care provider. Make sure you discuss any questions you have with your  health care provider.   Document Released: 12/06/2003 Document Revised: 03/30/2014 Document Reviewed: 12/19/2012 [employer] Interactive Patient Education 2016 [employer]  Dysuria Dysuria is pain or discomfort while urinating. The pain or discomfort may be felt in the tube that carries urine out of the bladder (urethra) or in the surrounding tissue of the genitals. The pain may also be felt in the  groin area, lower abdomen, and lower back. You may have to urinate frequently or have the sudden feeling that you have to urinate (urgency). Dysuria can affect both men and women, but is more common in women. Dysuria can be caused by many different things, including:  Urinary tract infection in women.  Infection of the kidney or bladder.  Kidney stones or bladder stones.  Certain sexually transmitted infections (STIs), such as chlamydia.  Dehydration.  Inflammation of the vagina.  Use of certain medicines.  Use of certain soaps or scented products that cause irritation. HOME CARE INSTRUCTIONS Watch your dysuria for any changes. The following actions may help to reduce any discomfort you are feeling:  Drink enough fluid to keep your urine clear or pale yellow.  Empty your bladder often. Avoid holding urine for long periods of time.  After a bowel movement or urination, women should cleanse from front to back, using each tissue only once.  Empty your bladder after sexual intercourse.  Take medicines only as directed by your health care provider.  If you were prescribed an antibiotic medicine, finish it all even if you start to feel better.  Avoid caffeine, tea, and alcohol. They can irritate the bladder and make dysuria worse. In men, alcohol may irritate the prostate.  Keep all follow-up visits as directed by your health care provider. This is important.  If you had any tests done to find the cause of dysuria, it is your responsibility to obtain your test results. Ask the lab or department performing the test when and how you will get your results. Talk with your health care provider if you have any questions about your results. SEEK MEDICAL CARE IF:  You develop pain in your back or sides.  You have a fever.  You have nausea or vomiting.  You have blood in your urine.  You are not urinating as often as you usually do. SEEK IMMEDIATE MEDICAL CARE IF:  You pain is  severe and not relieved with medicines.  You are unable to hold down any fluids.  You or someone else notices a change in your mental function.  You have a rapid heartbeat at rest.  You have shaking or chills.  You feel extremely weak.   This information is not intended to replace advice given to you by your health care provider. Make sure you discuss any questions you have with your health care provider.   Document Released: 12/06/2003 Document Revised: 03/30/2014 Document Reviewed: 11/02/2013 [employer] Interactive Patient Education [employer]

## 2015-09-09 NOTE — ED Provider Notes (Signed)
[facility]  ____________________________________________  Time seen: Approximately 10:20 AM  I have reviewed the triage vital signs and the nursing notes.   HISTORY  Chief Complaint Dysuria; Urinary Urgency; Urinary Frequency; and Abdominal Pain   HPI Carol Best is a 45 y.o. female presents with complaint of urinary frequency and some intermittent burning with urination since this past Thursday. Patient reports that she is concerned that she has urinary tract infection and she reports that she has had frequent urinary tract infections since her hysterectomy 5 years ago. Patient states normally sexual intercourse as a trigger for UTIs but states that that is not the case currently. Patient reports also complaining that she is having some suprapubic mid tenderness and some right-sided low back pain. Denies pain radiation. Denies blood in urine. Denies any vaginal discharge, vaginal bleeding or vaginal odor. Denies vaginal complaints. Patient reports last bowel movement was approximately 4 days ago. Reports some intermittent history of constipation. Reports she does not drink water very frequently does she know she should do better per patient. Denies any fall or trauma or strenuous activity. Reports continues to eat and drink as normal. Reports last urinary tract infection was several months ago.  Patient reports that her current symptoms are exactly the same as when she has a urinary tract infection the past. Patient denies any pain radiation. Denies any increasing pain. Patient states that pain is mild. Patient reports that she did have to wake up in the middle of night last night to urinate and states that this is abnormal for her. States last sexual intercourse was approximately 3 weeks ago. Patient reports last bowel movement was this past Thursday and states that that stool was fairly hard. Patient reports that she does have intermittent history of constipation with hard stools.  Patient states that she normally has a bowel movement every 2-3 days. Denies any blood in stool, blood in toilet, abnormal or dark-colored stool or blood with wiping.  Denies chest pain, shortness of breath, fevers, headache, dizziness, weakness, extremity pain, extremity swelling or recent hospitalizations.   Past Medical History  Diagnosis Date  . Hypertension   . Urinary tract infection     There are no active problems to display for this patient.   Past Surgical History  Procedure Laterality Date  . Abdominal hysterectomy    . Cholecystectomy      Current Outpatient Rx  Name  Route  Sig  Dispense  Refill  . atenolol (TENORMIN) 25 MG tablet   Oral   Take 25 mg by mouth daily.         . hydrochlorothiazide (HYDRODIURIL) 25 MG tablet   Oral   Take 25 mg by mouth daily.         Marland Kitchen. HYDROcodone-acetaminophen (NORCO) 5-325 MG per tablet   Oral   Take 1 tablet by mouth every 6 (six) hours as needed. bright         .           .           .           . phenazopyridine (PYRIDIUM) 200 MG tablet   Oral   Take 200 mg by mouth once.           Allergies Review of patient's allergies indicates no known allergies.  History reviewed. No pertinent family history.  Social History Social History  Substance Use Topics  . Smoking status: Current Every Day Smoker -- 0.50  packs/day  . Smokeless tobacco: None  . Alcohol Use: No    Review of Systems Constitutional: No fever/chills Eyes: No visual changes. ENT: No sore throat. Cardiovascular: Denies chest pain. Respiratory: Denies shortness of breath. Gastrointestinal: No abdominal pain.  No nausea, no vomiting.  No diarrhea.  No constipation. Genitourinary: Positive for dysuria. Musculoskeletal: Negative for back pain. Skin: Negative for rash. Neurological: Negative for headaches, focal weakness or numbness.  10-point ROS otherwise negative.  ____________________________________________   PHYSICAL  EXAM:  VITAL SIGNS: ED Triage Vitals  Enc Vitals Group     BP 09/09/15 1010 163/100 mmHg     Pulse Rate 09/09/15 1010 60     Resp 09/09/15 1010 16     Temp 09/09/15 1010 98.2 F (36.8 C)     Temp Source 09/09/15 1010 Oral     SpO2 09/09/15 1010 100 %     Weight 09/09/15 1010 182 lb (82.555 kg)     Height 09/09/15 1010 5\' 8"  (1.727 m)     Head Cir --      Peak Flow --      Pain Score 09/09/15 1016 7     Pain Loc --      Pain Edu? --      Excl. in GC? --     Constitutional: Alert and oriented. Well appearing and in no acute distress. Eyes: Conjunctivae are normal. PERRL. EOMI. Head: Atraumatic.  Ears: Normal external appearance bilaterally.   Nose: No congestion/rhinnorhea.  Mouth/Throat: Mucous membranes are moist.  Oropharynx non-erythematous. Neck: No stridor.  No cervical spine tenderness to palpation. Hematological/Lymphatic/Immunilogical: No cervical lymphadenopathy. Cardiovascular: Normal rate, regular rhythm. Grossly normal heart sounds.  Good peripheral circulation. Respiratory: Normal respiratory effort.  No retractions. Lungs CTAB. No wheezes, rales or rhonchi. Gastrointestinal: Mild midline suprapubic and right suprapubic tenderness, abdomen otherwise Soft and nontender. No pain at McBurney's point. Negative roving sign. Negative obturator sign. No distention. Normal Bowel sounds.   No CVA tenderness. Musculoskeletal: No lower or upper extremity tenderness nor edema. No midline cervical, thoracic or lumbar tenderness to palpation. Mild right lower paralumbar tenderness to palpation, no swelling, no ecchymosis, full range of motion, Pain unchanged with range of motion. Bilateral lower extremities nontender.  Neurologic:  Normal speech and language. No gross focal neurologic deficits are appreciated. No gait instability. Skin:  Skin is warm, dry and intact. No rash noted. Psychiatric: Mood and affect are normal. Speech and behavior are  normal.  ____________________________________________   LABS (all labs ordered are listed, but only abnormal results are displayed)  Labs Reviewed  URINALYSIS COMPLETEWITH MICROSCOPIC ([facility] ONLY) - Abnormal; Notable for the following:    Squamous Epithelial / LPF 0-5 (*)    All other components within normal limits  BASIC METABOLIC PANEL  CBC WITH DIFFERENTIAL/PLATELET    RADIOLOGY ABDOMEN - 1 VIEW  COMPARISON: Upper GI of 05/19/2013  FINDINGS: Three supine views. Cholecystectomy. Moderate amount of stool within the ascending and descending colon. Probable phleboliths in the pelvis. No small bowel distension. Distal gas and stool. No gross free intraperitoneal air on these supine images. No abnormal abdominal calcifications. No appendicolith.  IMPRESSION: No acute findings.   Electronically Signed By: Jeronimo Greaves M.D. On: 09/09/2015 11:34  ____________________________________________    INITIAL IMPRESSION / ASSESSMENT AND PLAN / ED COURSE  Pertinent labs & imaging results that were available during my care of the patient were reviewed by me and considered in my medical decision making (see chart for details).  Very well-appearing  patient. Smiling and laughing in room. No acute distress. Presents for the complaints of 3 days of dysuria with minimal associated suprapubic discomfort and right low back pain. Denies fall or trauma.Last bowel movement approximately 4 days ago. Concern for constipation.  Patient reports she has had the exact same presentation in the past when she's had urinary tract infections. Will evaluate urinalysis.  Urinalysis reviewed and discussed with patient. No bacteria, negative leukocytes, 0-5 WBCs and RBCs, do not suspect urinary tract infection. Will also evaluate CBC, BMP and KUB.  Labs reviewed, unremarkable. KUB with no acute findings however with moderate amount of stool within the ascending and descending colon per radiologist.  Discussed with patient unsure exact etiology for dysuria however do suspect constipation is contributing to her discomfort. Denies any history of kidney stones, negative McBurney point tenderness, no vomiting or diarrhea. Suspect constipation. Discussed with patient if any increase abdominal pain proceed directly to be reevaluated in urgent care or ER. Will treat patient with oral Colace and patient states will take over-the-counter Metamucil. Increase water intake and supportive measures. Encourage close follow-up with PCP this week.  Discussed follow up with Primary care physician this week. Discussed follow up and return parameters including no resolution or any worsening concerns. Patient verbalized understanding and agreed to plan.   ____________________________________________   FINAL CLINICAL IMPRESSION(S) / ED DIAGNOSES  Final diagnoses:  Dysuria  Constipation, unspecified constipation type     Discharge Medication List as of 09/09/2015 11:49 AM      Note: This dictation was prepared with Dragon dictation along with smaller phrase technology. Any transcriptional errors that result from this process are unintentional.       Renford Dills, NP 09/24/15 1011

## 2015-09-09 NOTE — ED Notes (Signed)
Pt states since her hysterectomy she has had frequent UTIs. Onset of dysuria Thursday worsening until today. Also urinary freq/urg, and lower quad abd pain. Denies fever.

## 2015-12-27 ENCOUNTER — Encounter: Payer: Self-pay | Admitting: Internal Medicine

## 2015-12-27 ENCOUNTER — Ambulatory Visit (INDEPENDENT_AMBULATORY_CARE_PROVIDER_SITE_OTHER): Payer: Managed Care, Other (non HMO) | Admitting: Internal Medicine

## 2015-12-27 VITALS — BP 138/96 | HR 91 | Temp 98.0°F | Ht 67.0 in | Wt 180.0 lb

## 2015-12-27 DIAGNOSIS — G8929 Other chronic pain: Secondary | ICD-10-CM | POA: Diagnosis not present

## 2015-12-27 DIAGNOSIS — M545 Low back pain, unspecified: Secondary | ICD-10-CM

## 2015-12-27 DIAGNOSIS — I10 Essential (primary) hypertension: Secondary | ICD-10-CM | POA: Diagnosis not present

## 2015-12-27 DIAGNOSIS — N390 Urinary tract infection, site not specified: Secondary | ICD-10-CM

## 2015-12-27 MED ORDER — ATENOLOL 25 MG PO TABS: 25.0000 mg | ORAL_TABLET | Freq: Every day | ORAL | 0 refills | Status: AC

## 2015-12-27 MED ORDER — HYDROCHLOROTHIAZIDE 25 MG PO TABS: 25.0000 mg | ORAL_TABLET | Freq: Every day | ORAL | 0 refills | Status: DC

## 2015-12-27 MED ORDER — TRIMETHOPRIM 100 MG PO TABS: ORAL_TABLET | ORAL | 0 refills | Status: DC

## 2015-12-27 NOTE — Progress Notes (Signed)
HPI  Pt presents to the clinic today to establish care and for management of the conditions listed below. She is transferring care from [facility].  HTN: She reports she is prescribed  HCTZ and Atenolol, but she has been out of her medications for  about 3 weeks. Her BP today is 138/96.  Recurrent UTI: She reports she usually gets these after intercourse, despite using the bathroom. 2 days later, she will get dysuria, pelvic pressure and low back pain. She has not taken anything OTC for this.  Low back pain: She reports this started1 year ago. She did not have any injury at that time. She takes Soma BID and Percocet as needed. She has not seen an orthopedist or had any xrays of her back. She is requesting a refill of her Tresa Garter and Percocet today.  Flu: never Tetanus: > 10 years Pap Smear: last year, total hysterectomy 2005 Mammogram: 2016,  Imaging Eye doctor: as needed Dentist: as needed  Past Medical History:  Diagnosis Date  . Hypertension   . Urinary tract infection     Current Outpatient Prescriptions  Medication Sig Dispense Refill  . atenolol (TENORMIN) 25 MG tablet Take 25 mg by mouth daily.    . carisoprodol (SOMA) 350 MG tablet Take 350 mg by mouth 2 (two) times daily as needed for muscle spasms.    Marland Kitchen docusate sodium (COLACE) 100 MG capsule Take 1 capsule (100 mg total) by mouth every 12 (twelve) hours. 14 capsule 0  . hydrochlorothiazide (HYDRODIURIL) 25 MG tablet Take 25 mg by mouth daily.    Marland Kitchen oxyCODONE-acetaminophen (PERCOCET/ROXICET) 5-325 MG per tablet Take 1-2 tablets by mouth every 4 (four) hours as needed for severe pain. 15 tablet 0   No current facility-administered medications for this visit.     No Known Allergies  No family history on file.  Social History   Social History  . Marital status: Married    Spouse name: N/A  . Number of children: N/A  . Years of education: N/A   Occupational History  . Not on file.   Social History Main  Topics  . Smoking status: Current Every Day Smoker    Packs/day: 0.50  . Smokeless tobacco: Not on file  . Alcohol use No  . Drug use: No  . Sexual activity: Not on file   Other Topics Concern  . Not on file   Social History Narrative  . No narrative on file    ROS:  Constitutional: Denies fever, malaise, fatigue, headache or abrupt weight changes.  Respiratory: Denies difficulty breathing, shortness of breath, cough or sputum production.   Cardiovascular: Denies chest pain, chest tightness, palpitations or swelling in the hands or feet.  GU: Pt reports recurrent UTI. Denies frequency, urgency, pain with urination, blood in urine, odor or discharge. Musculoskeletal: Pt reports low back pain. Denies decrease in range of motion, difficulty with gait, or joint pain and swelling.  Skin: Denies redness, rashes, lesions or ulcercations.  Neurological: Denies dizziness, difficulty with memory, difficulty with speech or problems with balance and coordination.  Psych: Denies anxiety, depression, SI/HI.  No other specific complaints in a complete review of systems (except as listed in HPI above).  PE:  BP (!) 138/96 (BP Location: Left Arm, Patient Position: Sitting, Cuff Size: Normal)   Pulse 91   Temp 98 F (36.7 C) (Oral)   Ht 5\' 7"  (1.702 m)   Wt 180 lb (81.6 kg)   SpO2 96%   BMI 28.19  kg/m  Wt Readings from Last 3 Encounters:  12/27/15 180 lb (81.6 kg)  09/09/15 182 lb (82.6 kg)  09/24/13 168 lb (76.2 kg)    General: Appears her stated age, well developed, well nourished in NAD. Cardiovascular: Normal rate and rhythm. S1,S2 noted.  No murmur, rubs or gallops noted.  Pulmonary/Chest: Normal effort and positive vesicular breath sounds. No respiratory distress. No wheezes, rales or ronchi noted.  Musculoskeletal: Normal flexion, extension and rotation of the spine. No bony tenderness noted over the spine. Pain with palpation noted of the paraspinal muscles. No difficulty with  gait.  Neurological: Alert and oriented.  Psychiatric: Mood and affect normal. Behavior is normal. Judgment and thought content normal.     BMET    Component Value Date/Time   NA 138 09/09/2015 1058   NA 138 03/22/2012 1925   K 4.6 09/09/2015 1058   K 2.8 (L) 03/22/2012 1925   CL 104 09/09/2015 1058   CL 102 03/22/2012 1925   CO2 28 09/09/2015 1058   CO2 28 03/22/2012 1925   GLUCOSE 96 09/09/2015 1058   GLUCOSE 109 (H) 03/22/2012 1925   BUN 7 09/09/2015 1058   BUN 9 03/22/2012 1925   CREATININE 0.82 09/09/2015 1058   CREATININE 0.77 03/22/2012 1925   CALCIUM 9.0 09/09/2015 1058   CALCIUM 8.9 03/22/2012 1925   GFRNONAA >60 09/09/2015 1058   GFRNONAA >60 03/22/2012 1925   GFRAA >60 09/09/2015 1058   GFRAA >60 03/22/2012 1925    Lipid Panel  No results found for: CHOL, TRIG, HDL, CHOLHDL, VLDL, LDLCALC  CBC    Component Value Date/Time   WBC 5.8 09/09/2015 1058   RBC 4.77 09/09/2015 1058   HGB 14.1 09/09/2015 1058   HGB 14.4 03/22/2012 1925   HCT 42.5 09/09/2015 1058   HCT 42.4 03/22/2012 1925   PLT 233 09/09/2015 1058   PLT 225 03/22/2012 1925   MCV 89.0 09/09/2015 1058   MCV 91 03/22/2012 1925   MCH 29.5 09/09/2015 1058   MCHC 33.2 09/09/2015 1058   RDW 13.6 09/09/2015 1058   RDW 13.5 03/22/2012 1925   LYMPHSABS 1.9 09/09/2015 1058   MONOABS 0.3 09/09/2015 1058   EOSABS 0.2 09/09/2015 1058   BASOSABS 0.1 09/09/2015 1058    Hgb A1C No results found for: HGBA1C   Assessment and Plan:

## 2015-12-29 ENCOUNTER — Encounter: Payer: Self-pay | Admitting: Internal Medicine

## 2015-12-29 DIAGNOSIS — G8929 Other chronic pain: Secondary | ICD-10-CM | POA: Insufficient documentation

## 2015-12-29 DIAGNOSIS — I10 Essential (primary) hypertension: Secondary | ICD-10-CM | POA: Insufficient documentation

## 2015-12-29 DIAGNOSIS — M545 Low back pain: Secondary | ICD-10-CM

## 2015-12-29 DIAGNOSIS — N390 Urinary tract infection, site not specified: Secondary | ICD-10-CM | POA: Insufficient documentation

## 2015-12-29 NOTE — Assessment & Plan Note (Signed)
I will not refill her Soma or Oxycodone without her having any workup on her back Will request records from previous PCP

## 2015-12-29 NOTE — Assessment & Plan Note (Signed)
Will start Trimpex Advised her to take 1 tab postcoital

## 2015-12-29 NOTE — Assessment & Plan Note (Signed)
Will refill HCTZ and Atenolol today Will check CMET at next visit

## 2015-12-29 NOTE — Patient Instructions (Signed)
Hypertension Hypertension, commonly called high blood pressure, is when the force of blood pumping through your arteries is too strong. Your arteries are the blood vessels that carry blood from your heart throughout your body. A blood pressure reading consists of a higher number over a lower number, such as 110/72. The higher number (systolic) is the pressure inside your arteries when your heart pumps. The lower number (diastolic) is the pressure inside your arteries when your heart relaxes. Ideally you want your blood pressure below 120/80. Hypertension forces your heart to work harder to pump blood. Your arteries may become narrow or stiff. Having untreated or uncontrolled hypertension can cause heart attack, stroke, kidney disease, and other problems. RISK FACTORS Some risk factors for high blood pressure are controllable. Others are not.  Risk factors you cannot control include:   Race. You may be at higher risk if you are African American.  Age. Risk increases with age.  Gender. Men are at higher risk than women before age 45 years. After age 65, women are at higher risk than men. Risk factors you can control include:  Not getting enough exercise or physical activity.  Being overweight.  Getting too much fat, sugar, calories, or salt in your diet.  Drinking too much alcohol. SIGNS AND SYMPTOMS Hypertension does not usually cause signs or symptoms. Extremely high blood pressure (hypertensive crisis) may cause headache, anxiety, shortness of breath, and nosebleed. DIAGNOSIS To check if you have hypertension, your health care provider will measure your blood pressure while you are seated, with your arm held at the level of your heart. It should be measured at least twice using the same arm. Certain conditions can cause a difference in blood pressure between your right and left arms. A blood pressure reading that is higher than normal on one occasion does not mean that you need treatment. If  it is not clear whether you have high blood pressure, you may be asked to return on a different day to have your blood pressure checked again. Or, you may be asked to monitor your blood pressure at home for 1 or more weeks. TREATMENT Treating high blood pressure includes making lifestyle changes and possibly taking medicine. Living a healthy lifestyle can help lower high blood pressure. You may need to change some of your habits. Lifestyle changes may include:  Following the DASH diet. This diet is high in fruits, vegetables, and whole grains. It is low in salt, red meat, and added sugars.  Keep your sodium intake below 2,300 mg per day.  Getting at least 30-45 minutes of aerobic exercise at least 4 times per week.  Losing weight if necessary.  Not smoking.  Limiting alcoholic beverages.  Learning ways to reduce stress. Your health care provider may prescribe medicine if lifestyle changes are not enough to get your blood pressure under control, and if one of the following is true:  You are 18-59 years of age and your systolic blood pressure is above 140.  You are 60 years of age or older, and your systolic blood pressure is above 150.  Your diastolic blood pressure is above 90.  You have diabetes, and your systolic blood pressure is over 140 or your diastolic blood pressure is over 90.  You have kidney disease and your blood pressure is above 140/90.  You have heart disease and your blood pressure is above 140/90. Your personal target blood pressure may vary depending on your medical conditions, your age, and other factors. HOME CARE INSTRUCTIONS    Have your blood pressure rechecked as directed by your health care provider.   Take medicines only as directed by your health care provider. Follow the directions carefully. Blood pressure medicines must be taken as prescribed. The medicine does not work as well when you skip doses. Skipping doses also puts you at risk for  problems.  Do not smoke.   Monitor your blood pressure at home as directed by your health care provider. SEEK MEDICAL CARE IF:   You think you are having a reaction to medicines taken.  You have recurrent headaches or feel dizzy.  You have swelling in your ankles.  You have trouble with your vision. SEEK IMMEDIATE MEDICAL CARE IF:  You develop a severe headache or confusion.  You have unusual weakness, numbness, or feel faint.  You have severe chest or abdominal pain.  You vomit repeatedly.  You have trouble breathing. MAKE SURE YOU:   Understand these instructions.  Will watch your condition.  Will get help right away if you are not doing well or get worse.   This information is not intended to replace advice given to you by your health care provider. Make sure you discuss any questions you have with your health care provider.   Document Released: 03/09/2005 Document Revised: 07/24/2014 Document Reviewed: 12/30/2012 Elsevier Interactive Patient Education 2016 [employer]  

## 2016-01-01 ENCOUNTER — Telehealth: Payer: Self-pay

## 2016-01-01 NOTE — Telephone Encounter (Signed)
We discussed recurrent UTI's after intercourse. She did not mention any urinary issues that day. She can not get a work note for being out today without being seen. Likewise, I will not send in a med for UTI, without testing her urine.

## 2016-01-01 NOTE — Telephone Encounter (Signed)
Pt left v/m; pt was seen 12/27/15 and discussed UTI; pt has UTI that is worse than when seen 12/27/15 and [facility] Pt also request work note to be out of work 01/01/16. Pt request cb.

## 2016-01-02 NOTE — Telephone Encounter (Signed)
Left detailed msg on VM per HIPAA  

## 2016-01-03 ENCOUNTER — Ambulatory Visit: Payer: Managed Care, Other (non HMO) | Admitting: Internal Medicine

## 2016-01-11 ENCOUNTER — Encounter (HOSPITAL_COMMUNITY): Payer: Self-pay

## 2016-01-11 ENCOUNTER — Emergency Department (HOSPITAL_COMMUNITY)
Admission: EM | Admit: 2016-01-11 | Discharge: 2016-01-11 | Disposition: A | Discharge status: Home / Self Care | Payer: Managed Care, Other (non HMO) | Attending: Emergency Medicine | Admitting: Emergency Medicine

## 2016-01-11 DIAGNOSIS — T7840XA Allergy, unspecified, initial encounter: Secondary | ICD-10-CM

## 2016-01-11 DIAGNOSIS — L500 Allergic urticaria: Secondary | ICD-10-CM | POA: Diagnosis not present

## 2016-01-11 DIAGNOSIS — I10 Essential (primary) hypertension: Secondary | ICD-10-CM | POA: Insufficient documentation

## 2016-01-11 DIAGNOSIS — F17200 Nicotine dependence, unspecified, uncomplicated: Secondary | ICD-10-CM | POA: Insufficient documentation

## 2016-01-11 DIAGNOSIS — R21 Rash and other nonspecific skin eruption: Secondary | ICD-10-CM | POA: Diagnosis present

## 2016-01-11 DIAGNOSIS — L509 Urticaria, unspecified: Secondary | ICD-10-CM

## 2016-01-11 MED ORDER — RANITIDINE HCL 150 MG/10ML PO SYRP
150.0000 mg | ORAL_SOLUTION | Freq: Once | ORAL | Status: AC
  Administered 2016-01-11: 150 mg via ORAL
  Filled 2016-01-11: qty 10

## 2016-01-11 MED ORDER — DEXAMETHASONE 4 MG PO TABS
6.0000 mg | ORAL_TABLET | Freq: Once | ORAL | Status: AC
  Administered 2016-01-11: 6 mg via ORAL
  Filled 2016-01-11: qty 2

## 2016-01-11 MED ORDER — DIPHENHYDRAMINE HCL 25 MG PO CAPS
50.0000 mg | ORAL_CAPSULE | Freq: Once | ORAL | Status: AC
  Administered 2016-01-11: 50 mg via ORAL
  Filled 2016-01-11: qty 2

## 2016-01-11 NOTE — ED Notes (Signed)
Pt took 2 x benadryl at 1900 with no relief

## 2016-01-11 NOTE — ED Provider Notes (Signed)
[facility] DEPT Provider Note   CSN: [id number] Arrival date & time: 01/11/16  2022     History   Chief Complaint Chief Complaint  Patient presents with  . Allergic Reaction    HPI Carol Best is a 45 y.o. female with a hx of HTN, presents noting a new laundry detergent which she used today, put on a freshly laundered shirt, and about an hour later around 6:00PM started having itching on her chest, back, and abdomen. She took Benadryl 50 mg at about 7:00PM to no significant improvement in her sx. She denies any SOB, chest pain, tongue swelling, nausea, vomiting, abdominal pain. She states that she has developed a diffuse urticarial rash in the distribution of her shirt. No history of prior anaphylactic reactions.     HPI  Past Medical History:  Diagnosis Date  . Hypertension   . Urinary tract infection     Patient Active Problem List   Diagnosis Date Noted  . Essential hypertension 12/29/2015  . Recurrent UTI 12/29/2015  . Chronic midline low back pain without sciatica 12/29/2015    Past Surgical History:  Procedure Laterality Date  . ABDOMINAL HYSTERECTOMY    . CHOLECYSTECTOMY      OB History    No data available       Home Medications    Prior to Admission medications   Medication Sig Start Date End Date Taking? Authorizing Provider  atenolol (TENORMIN) 25 MG tablet Take 1 tablet (25 mg total) by mouth daily. 12/27/15   Lorre Munroe, NP  carisoprodol (SOMA) 350 MG tablet Take 350 mg by mouth 2 (two) times daily as needed for muscle spasms.    Historical Provider, MD  docusate sodium (COLACE) 100 MG capsule Take 1 capsule (100 mg total) by mouth every 12 (twelve) hours. 09/09/15   Renford Dills, NP  hydrochlorothiazide (HYDRODIURIL) 25 MG tablet Take 1 tablet (25 mg total) by mouth daily. 12/27/15   Lorre Munroe, NP  oxyCODONE-acetaminophen (PERCOCET/ROXICET) 5-325 MG per tablet Take 1-2 tablets by mouth every 4 (four) hours as needed for severe pain.  09/24/13   Jillyn Ledger, PA-C  trimethoprim (TRIMPEX) 100 MG tablet 1 tab po as needed after sexual intercourse 12/27/15   Lorre Munroe, NP    Family History Family History  Problem Relation Age of Onset  . Diabetes Brother   . Hypertension Brother   . Hyperlipidemia Brother   . Cancer Neg Hx   . Stroke Neg Hx     Social History Social History  Substance Use Topics  . Smoking status: Current Every Day Smoker    Packs/day: 0.50    Years: 20.00  . Smokeless tobacco: Never Used  . Alcohol use No     Allergies   Review of patient's allergies indicates no known allergies.   Review of Systems Review of Systems  Constitutional: Negative for activity change, appetite change, chills and fever.  HENT: Negative for congestion, ear pain, facial swelling, rhinorrhea, sinus pressure, sneezing, sore throat, trouble swallowing and voice change.   Eyes: Negative for photophobia, redness and itching.  Respiratory: Negative for cough, chest tightness, shortness of breath, wheezing and stridor.   Cardiovascular: Negative for chest pain.  Gastrointestinal: Negative for abdominal pain, diarrhea, nausea and vomiting.  Musculoskeletal: Negative for back pain and neck pain.  Skin: Positive for rash.  Neurological: Negative for dizziness, syncope, speech difficulty, light-headedness and headaches.  All other systems reviewed and are negative.    Physical Exam  Updated Vital Signs BP (!) 165/126   Pulse 64   Temp 98.4 F (36.9 C)   Ht 5\' 8"  (1.727 m)   Wt 81.6 kg   SpO2 100%   BMI 27.37 kg/m   Physical Exam  Constitutional: She is oriented to person, place, and time. She appears well-developed and well-nourished. No distress.  HENT:  Head: Normocephalic and atraumatic.  Nose: Nose normal.  Mouth/Throat: Oropharynx is clear and moist.  Eyes: Conjunctivae and EOM are normal. Pupils are equal, round, and reactive to light.  Neck: Neck supple.  Cardiovascular: Normal rate, regular  rhythm, normal heart sounds and intact distal pulses.   Pulmonary/Chest: Effort normal and breath sounds normal.  Abdominal: Soft. She exhibits no distension. There is no tenderness.  Musculoskeletal: She exhibits no edema or tenderness.  Neurological: She is alert and oriented to person, place, and time. No cranial nerve deficit. Coordination normal.  Skin: Skin is warm and dry. Rash noted. Rash is urticarial. She is not diaphoretic. There is erythema.  Scattered urticaria on chest, back, abdomen.   Nursing note and vitals reviewed.    ED Treatments / Results  Labs (all labs ordered are listed, but only abnormal results are displayed) Labs Reviewed - No data to display  EKG  EKG Interpretation None       Radiology No results found.  Procedures Procedures (including critical care time)  Medications Ordered in ED Medications  ranitidine (ZANTAC) 150 MG/10ML syrup 150 mg (150 mg Oral Given 01/11/16 2133)  diphenhydrAMINE (BENADRYL) capsule 50 mg (50 mg Oral Given 01/11/16 2133)  dexamethasone (DECADRON) tablet 6 mg (6 mg Oral Given 01/11/16 2133)     Initial Impression / Assessment and Plan / ED Course  I have reviewed the triage vital signs and the nursing notes.  Pertinent labs & imaging results that were available during my care of the patient were reviewed by me and considered in my medical decision making (see chart for details).  Clinical Course   45 y.o. female presents with urticaria after using new detergent. Given benadryl, zantac, and decadron and had symptomatic improvement. Exam as above with no significant concern for anaphylaxis. She was advised to re-wash her newly laundered clothes in hot water in an effort to remove allergen and was advised to avoid using this new detergent any further. She was advised to continue using benadryl as needed for itching. Return precautions were given for onset of respiratory symptoms, worsening, or concerns. This plan was  discussed with the patient and her family at the bedside and they stated both understanding and agreement.   Final Clinical Impressions(s) / ED Diagnoses   Final diagnoses:  Urticaria  Allergic reaction, initial encounter    New Prescriptions Discharge Medication List as of 01/11/2016  9:26 PM       Francoise CeoWarren S Shermaine Brigham, DO 01/12/16 1517    Blane OharaJoshua Zavitz, MD 01/13/16 [phone]

## 2016-01-11 NOTE — ED Triage Notes (Signed)
Pt comes in c/o unknown allergic reaction; Pt states she changed laundry detergents as well as started using new perfume oil today; pt unsure of source of reaction; pt  Presents with hives on entire trunk front and back and bilateral arms; Pt denies SOB or chest pain on arrival; pt a&ox 4 on arrival.

## 2016-02-26 ENCOUNTER — Encounter: Payer: Self-pay | Admitting: Internal Medicine

## 2016-03-27 ENCOUNTER — Other Ambulatory Visit: Payer: Self-pay | Admitting: Internal Medicine

## 2016-07-13 ENCOUNTER — Encounter (HOSPITAL_COMMUNITY): Payer: Self-pay | Admitting: Psychiatry

## 2016-07-13 ENCOUNTER — Ambulatory Visit (INDEPENDENT_AMBULATORY_CARE_PROVIDER_SITE_OTHER): Payer: 59 | Admitting: Psychiatry

## 2016-07-13 ENCOUNTER — Encounter (INDEPENDENT_AMBULATORY_CARE_PROVIDER_SITE_OTHER): Payer: Self-pay

## 2016-07-13 VITALS — BP 128/80 | HR 68 | Ht 68.0 in | Wt 189.6 lb

## 2016-07-13 DIAGNOSIS — F17210 Nicotine dependence, cigarettes, uncomplicated: Secondary | ICD-10-CM | POA: Diagnosis not present

## 2016-07-13 DIAGNOSIS — Z79899 Other long term (current) drug therapy: Secondary | ICD-10-CM | POA: Diagnosis not present

## 2016-07-13 DIAGNOSIS — F419 Anxiety disorder, unspecified: Secondary | ICD-10-CM | POA: Diagnosis not present

## 2016-07-13 DIAGNOSIS — F330 Major depressive disorder, recurrent, mild: Secondary | ICD-10-CM | POA: Diagnosis not present

## 2016-07-13 MED ORDER — DULOXETINE HCL 20 MG PO CPEP: ORAL_CAPSULE | ORAL | 0 refills | Status: DC

## 2016-07-13 NOTE — Progress Notes (Signed)
Psychiatric Initial Adult Assessment   Patient Identification: Carol Best MRN:  [id number] Date of Evaluation:  07/13/2016 Referral Source: Self referred  Chief Complaint:   depression and anxiety Visit Diagnosis:    ICD-9-CM ICD-10-CM   1. 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  Patient told that she's been experiencing anxiety and depression for past few months when she find out that her 5 year old son joined [employer].  Patient told she had protected her son all his life and now she is feeling very anxious and sad.  Patient told it was shock for her when she find out that her son joined [employer].  She mentioned that for past few months he is talking about it but she believed that he is not serious until he sworn in 36th and on 7010 he shipped to [employer] in [place].  Patient just visited him last Friday.  Patient experiencing crying spells, irritability, feeling hopeless, helpless and anhedonia.  She also endorsed poor sleep and having racing thoughts.  Patient told her son bigness his grandmother murdered when he was only 57 years old.  Apparently patient's mother was murdered by her boyfriend 18 years ago and soon after that her son developed severe anxiety and he was seen psychologist and therapist and patient did not let him alone from her sight.  Patient admitted having panic-like symptoms when she thinks about separation from him.  Though she denies any paranoia, suicidal thoughts or homicidal thought but endorsed feeling fatigue, lack of energy, lack of attention and concentration.  She's been only sleeping 2 hours.  Currently she is not taking any medicine for depression or anxiety and not seeing any therapist.  Patient denies any  psychotic symptoms, PTSD, OCD or any self abusive behavior.  Recently she started gabapentin for her neck pain.  She is open to try SSRIs and to see a therapist.  Patient lives with her husband and 66 year old daughter.  Patient works at a factory for past 6 years.  Patient has good support system.  Patient denies drinking alcohol or using any illegal substances.  Associated Signs/Symptoms: Depression Symptoms:  depressed mood, anhedonia, insomnia, fatigue, difficulty concentrating, hopelessness, anxiety, disturbed sleep, (Hypo) Manic Symptoms:  Irritable Mood, Anxiety Symptoms:  Excessive Worry, Panic Symptoms, Social Anxiety, Psychotic Symptoms:  no psychotic symptoms PTSD Symptoms: NA  Past Psychiatric History:  Patient remember briefly admitted at [facility] in 1999 when her mother was murdered by her boyfriend.  She remember having nervous breakdown but did not stay in the hospital for more than 72 hours.  She do not remember if she was given any medication.  Previous Psychotropic Medications: No   Substance Abuse History in the last 12 months:  No.  Consequences of Substance Abuse: Negative  Past Medical History:  Past Medical History:  Diagnosis Date  . Hiatal hernia with GERD    Patient has sx to remove  . Hypertension   . Urinary tract infection     Past Surgical History:  Procedure Laterality Date  . ABDOMINAL HYSTERECTOMY    . CHOLECYSTECTOMY      Family Psychiatric History: Patient denies any family history of psychiatric illness.  Family History:  Family History  Problem Relation Age of Onset  . Diabetes Brother   . Hypertension  Brother   . Hyperlipidemia Brother   . Cancer Neg Hx   . Stroke Neg Hx     Social History:   Social History   Social History  . Marital status: Married    Spouse name: N/A  . Number of children: N/A  . Years of education: N/A   Social History Main Topics  . Smoking status: Current Every Day Smoker     Packs/day: 0.50    Years: 20.00  . Smokeless tobacco: Never Used  . Alcohol use No  . Drug use: No  . Sexual activity: Yes   Other Topics Concern  . None   Social History Narrative  . None    Additional Social History: Patient born and raised in [place].  Her parents divorced at early age.  She was raised by her mother and grandmother.  Patient was never close to her father.  Patient married to her husband 10 years ago but they have known each other for more than 23 years.  Patient has one son who was 24 years old and daughter who is 54 year old.  Patient has good support system.  Allergies:  No Known Allergies  Metabolic Disorder Labs: No results found for this or any previous visit (from the past 2160 hour(s)). No results found for: HGBA1C, MPG No results found for: PROLACTIN No results found for: CHOL, TRIG, HDL, CHOLHDL, VLDL, LDLCALC   Current Medications: Current Outpatient Prescriptions  Medication Sig Dispense Refill  . atenolol (TENORMIN) 25 MG tablet Take 1 tablet (25 mg total) by mouth daily. 90 tablet 0  . carisoprodol (SOMA) 350 MG tablet Take 350 mg by mouth 2 (two) times daily as needed for muscle spasms.    Marland Kitchen gabapentin (NEURONTIN) 300 MG capsule Take 300 mg by mouth 3 (three) times daily.    . hydrochlorothiazide (HYDRODIURIL) 25 MG tablet TAKE 1 TABLET (25 MG TOTAL) BY MOUTH DAILY. 90 tablet 2   No current facility-administered medications for this visit.     Neurologic: Headache: No Seizure: No Paresthesias:No  Musculoskeletal: Strength & Muscle Tone: within normal limits Gait & Station: normal Patient leans: N/A  Psychiatric Specialty Exam: Review of Systems  Constitutional: Negative.   HENT: Negative.   Gastrointestinal: Negative.   Musculoskeletal: Positive for joint pain and neck pain.  Neurological: Negative.   Psychiatric/Behavioral: Positive for depression. The patient is nervous/anxious and has insomnia.     Blood pressure  128/80, pulse 68, height  (1.727 m), weight 189 lb 9.6 oz (86 kg).Body mass index is 28.83 kg/m.  General Appearance: Casual  Eye Contact:  Good  Speech:  Slow  Volume:  Normal  Mood:  Depressed and Dysphoric  Affect:  Constricted and Depressed  Thought Process:  Goal Directed  Orientation:  Full (Time, Place, and Person)  Thought Content:  WDL, Logical and Rumination  Suicidal Thoughts:  No  Homicidal Thoughts:  No  Memory:  Immediate;   Good Recent;   Good Remote;   Good  Judgement:  Good  Insight:  Good  Psychomotor Activity:  Normal  Concentration:  Concentration: Good and Attention Span: Good  Recall:  Good  Fund of Knowledge:Good  Language: Good  Akathisia:  No  Handed:  Right  AIMS (if indicated):  0  Assets:  Communication Skills Desire for Improvement Housing Resilience Social Support  ADL's:  Intact  Cognition: WNL  Sleep:  3 hrs   Assessment: Major depressive disorder, recurrent.  Anxiety disorder NOS.  Plan: I review her  symptoms, history, current medication and psychosocial stressors.  Reassurance given.  We will start Cymbalta 20 mg daily for 1 week and then 40 mg daily.  I explained that Cymbalta may help her neck pain also.  Discussed medication side effects and benefits.  I also suggested to her that she should see therapist for coping social skills.  We will schedule appointment with a therapist in this office.  I recommended to call us back if she has any question, concern or if she feels worsening of the symptom.  Discuss safety plan that anytime having active suicidal thoughts or homicidal thoughts then she need to call 911 or go to the local emergency room.  Follow-up in 4 weeks.  ARFEEN,SYED T., MD 4/23/20189:25 AM

## 2016-08-07 ENCOUNTER — Ambulatory Visit (HOSPITAL_COMMUNITY): Payer: Self-pay | Admitting: Psychiatry

## 2016-08-12 ENCOUNTER — Other Ambulatory Visit (HOSPITAL_COMMUNITY): Payer: Self-pay | Admitting: Psychiatry

## 2016-08-12 DIAGNOSIS — F330 Major depressive disorder, recurrent, mild: Secondary | ICD-10-CM

## 2016-08-14 ENCOUNTER — Ambulatory Visit (HOSPITAL_COMMUNITY): Payer: Self-pay | Admitting: Psychiatry

## 2016-08-18 ENCOUNTER — Ambulatory Visit (HOSPITAL_COMMUNITY): Payer: Self-pay | Admitting: Psychiatry

## 2016-08-20 IMAGING — CR DG ABDOMEN 1V
3 series · 3 of 3 positions shown · non-contrast
Comparison: Upper GI of 05/19/2013

CLINICAL DATA: Right lower quadrant and back pain for 5 days with
urinary frequency. Cholecystectomy. Hysterectomy. Constipation.

EXAM:
ABDOMEN - 1 VIEW

[abdomen kub (1 of 3)]
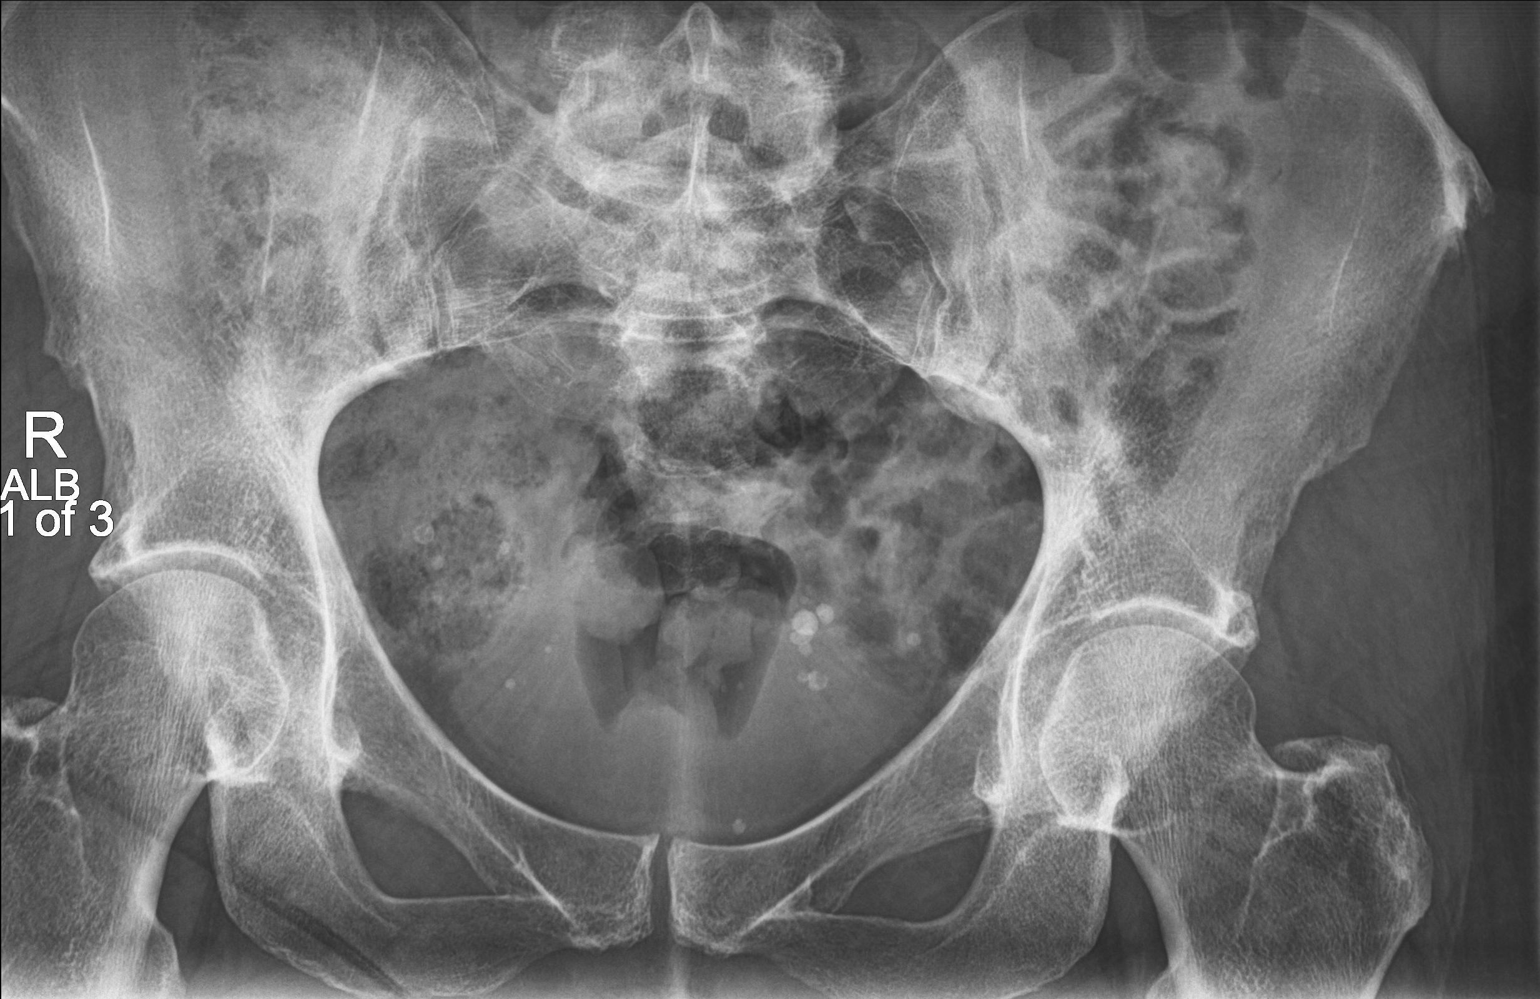

[abdomen kub (2 of 3)]
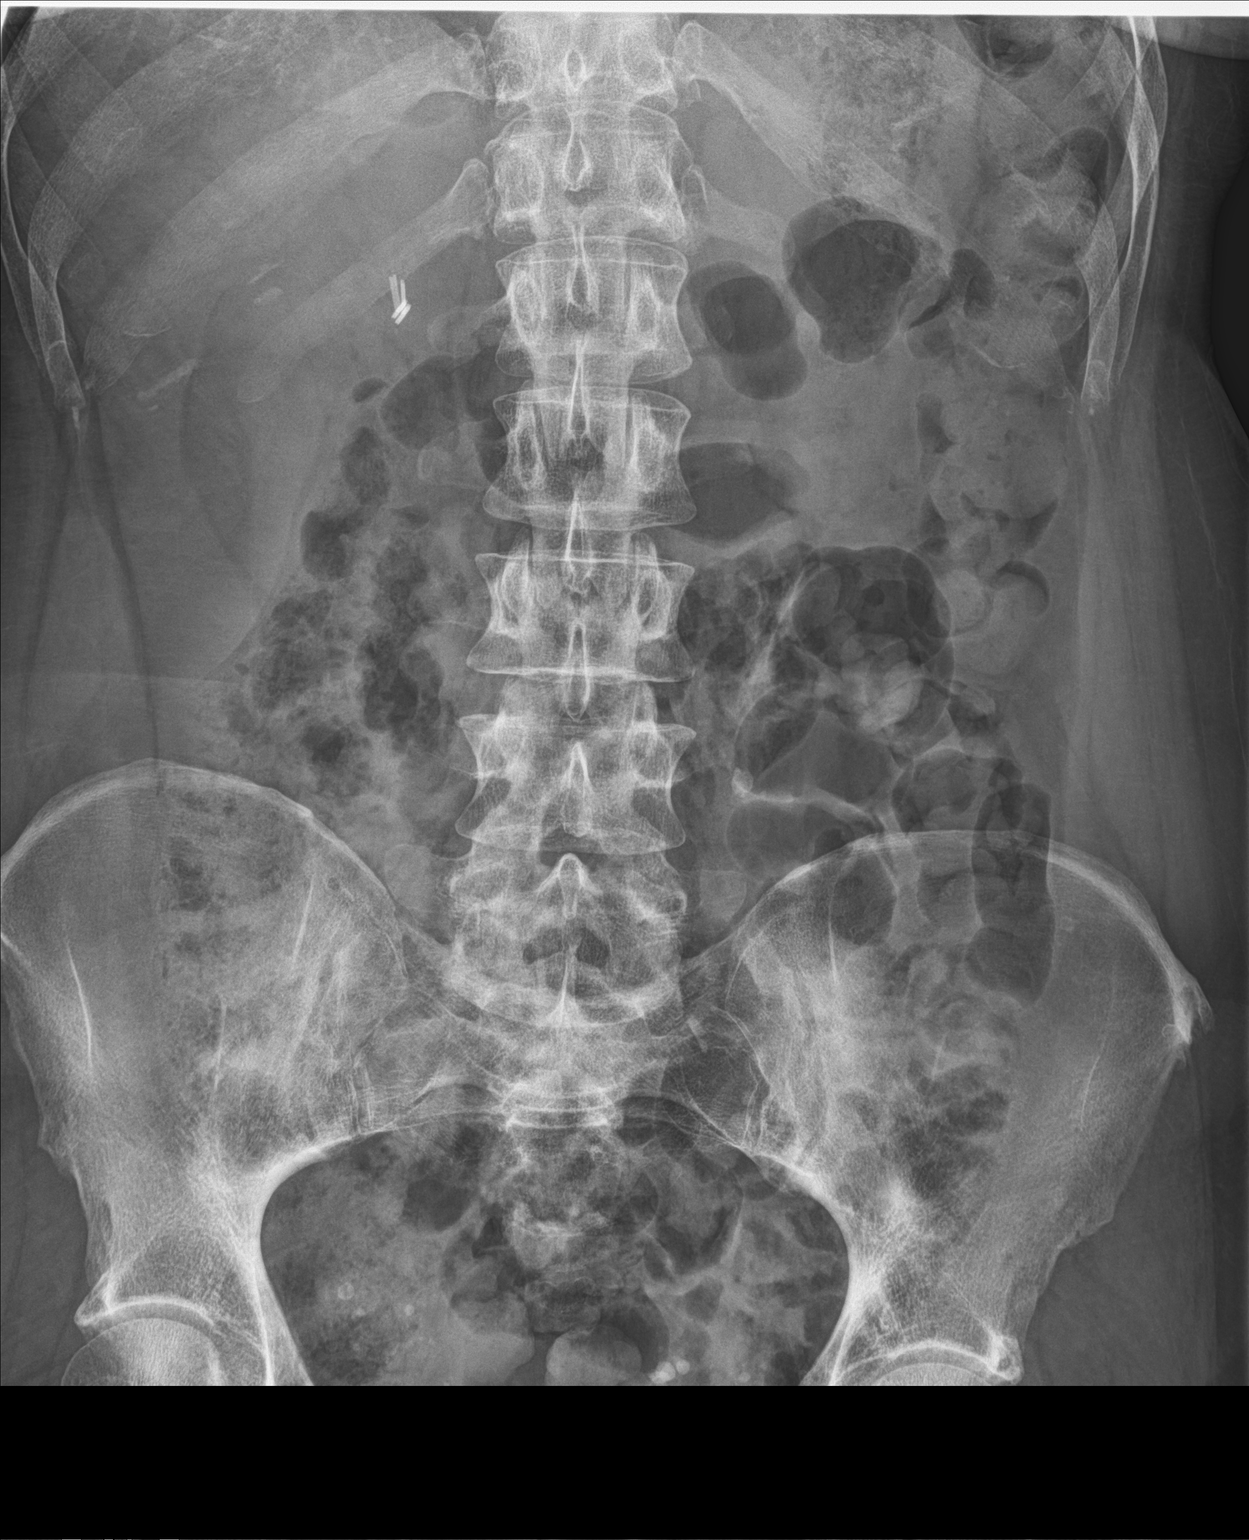

[abdomen kub (3 of 3)]
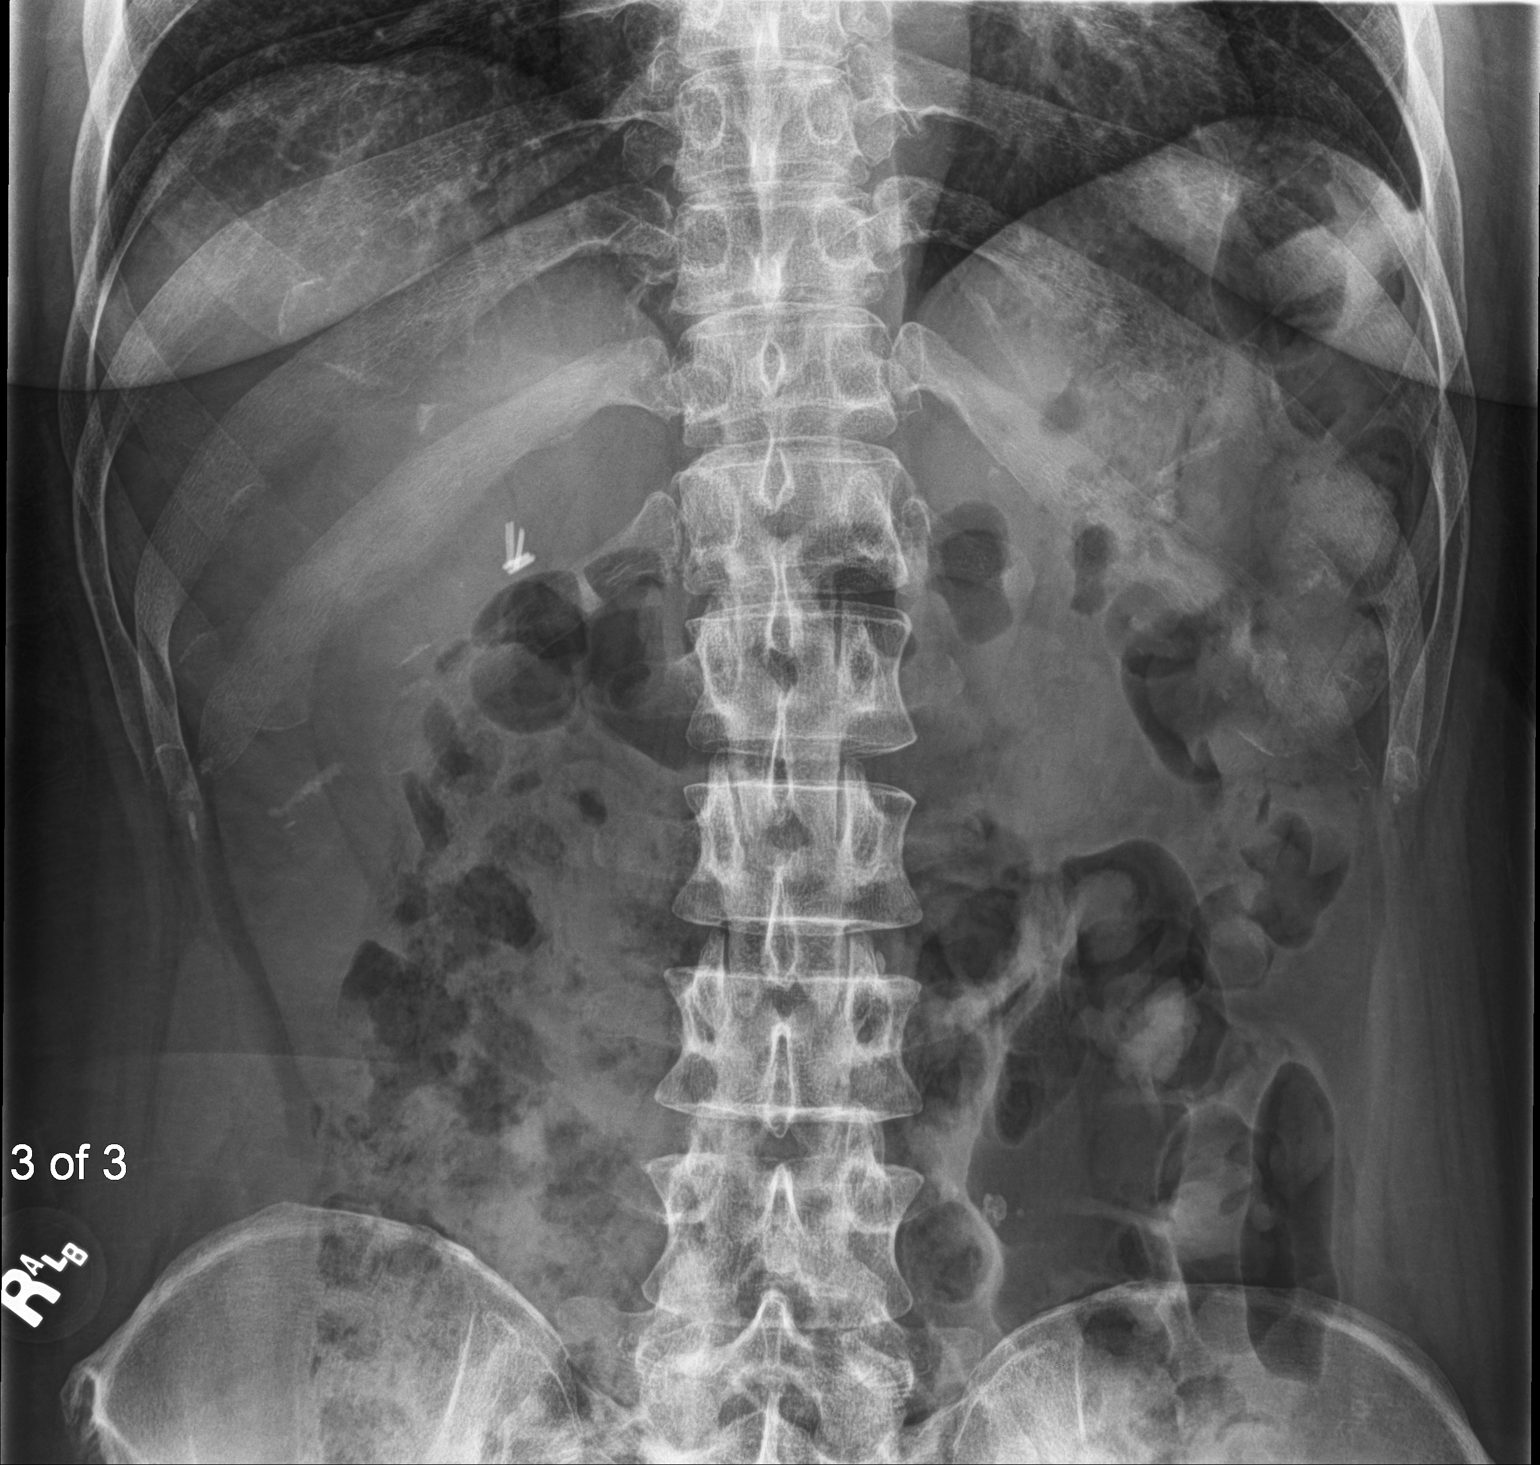

[3 of 3 positions shown; findings below may reference images not displayed]

FINDINGS: Three supine views. Cholecystectomy. Moderate amount of stool within
the ascending and descending colon. Probable phleboliths in the
pelvis. No small bowel distension. Distal gas and stool. No gross
free intraperitoneal air on these supine images. No abnormal
abdominal calcifications. No appendicolith.
IMPRESSION: No acute findings.

## 2016-12-01 ENCOUNTER — Ambulatory Visit (INDEPENDENT_AMBULATORY_CARE_PROVIDER_SITE_OTHER): Payer: 59 | Admitting: Psychiatry

## 2016-12-01 ENCOUNTER — Encounter (HOSPITAL_COMMUNITY): Payer: Self-pay | Admitting: Psychiatry

## 2016-12-01 VITALS — BP 140/76 | HR 90 | Ht 68.0 in | Wt 188.0 lb

## 2016-12-01 DIAGNOSIS — M549 Dorsalgia, unspecified: Secondary | ICD-10-CM | POA: Diagnosis not present

## 2016-12-01 DIAGNOSIS — Z6379 Other stressful life events affecting family and household: Secondary | ICD-10-CM | POA: Diagnosis not present

## 2016-12-01 DIAGNOSIS — F419 Anxiety disorder, unspecified: Secondary | ICD-10-CM | POA: Diagnosis not present

## 2016-12-01 DIAGNOSIS — G4700 Insomnia, unspecified: Secondary | ICD-10-CM | POA: Diagnosis not present

## 2016-12-01 DIAGNOSIS — M2550 Pain in unspecified joint: Secondary | ICD-10-CM

## 2016-12-01 DIAGNOSIS — R450 Nervousness: Secondary | ICD-10-CM

## 2016-12-01 DIAGNOSIS — F17210 Nicotine dependence, cigarettes, uncomplicated: Secondary | ICD-10-CM | POA: Diagnosis not present

## 2016-12-01 DIAGNOSIS — F330 Major depressive disorder, recurrent, mild: Secondary | ICD-10-CM

## 2016-12-01 DIAGNOSIS — R51 Headache: Secondary | ICD-10-CM

## 2016-12-01 MED ORDER — DULOXETINE HCL 20 MG PO CPEP: ORAL_CAPSULE | ORAL | 0 refills | Status: DC

## 2016-12-01 MED ORDER — TRAZODONE HCL 50 MG PO TABS: 50.0000 mg | ORAL_TABLET | Freq: Every day | ORAL | 0 refills | Status: DC

## 2016-12-01 NOTE — Progress Notes (Signed)
[facility] MD/PA/NP OP Progress Note  12/01/2016 11:16 AM Carol Best  MRN:  [id number]  Chief Complaint:  I'm feeling down and depressed.  I am ran out from the medication.  HPI: Patient is 46 year old African-American employed married female who was seen first time in April for the management of depression and anxiety symptoms.  She was given Cymbalta and she took Cymbalta for 4 weeks but she stopped after feeling better and felt that she does not need education.  Patient told that her son is graduated and now join the [employer] in [place].  She misses him a lot.  She is having a lot of difficulty since he left.  Recently she is also in between his son and his girlfriend.  Apparently his son is separated from his girlfriend but his girlfriend is approaching patient and patient is feeling very stressed out.  She admitted having crying spells, irritability, poor sleep and racing thoughts.  She has been not able to go to work for past 2 days.  She feels hopeless helpless and worthless.  She realized that Cymbalta was helping her and she regrets that she did not continue.  Patient is very close to her son.  Patient denies any paranoia, hallucination, suicidal thoughts or homicidal thought.  However she feels easily anxious, having crying spells and worried about her son.  She also combating of chronic back pain.  She stop the gabapentin after developed side effects.  She do not recall any side effects from the Cymbalta.  She lives with her husband and 38 year old daughter.  She is working in a factory for past 6 years.  She had a good support system.  Patient denies drinking alcohol or using any illegal substances.  Her appetite is okay.  Her vital signs are stable.  Visit Diagnosis:    ICD-10-CM   1. Mild episode of recurrent major depressive disorder (HCC) F33.0 DULoxetine (CYMBALTA) 20 MG capsule    traZODone (DESYREL) 50 MG tablet     Past Psychiatric History: Reviewed. Patient was briefly admitted at  [facility] in 1999 when her mother was murdered by her boyfriend.  She remember having nervous breakdown but do not recall given any medication.  Past Medical History:  Past Medical History:  Diagnosis Date  . Hiatal hernia with GERD    Patient has sx to remove  . Hypertension   . Urinary tract infection     Past Surgical History:  Procedure Laterality Date  . ABDOMINAL HYSTERECTOMY    . CHOLECYSTECTOMY    . SHOULDER ARTHROSCOPY WITH ROTATOR CUFF REPAIR      Family Psychiatric History: Reviewed.  Family History:  Family History  Problem Relation Age of Onset  . Diabetes Brother   . Hypertension Brother   . Hyperlipidemia Brother   . Cancer Neg Hx   . Stroke Neg Hx     Social History:  Social History   Social History  . Marital status: Married    Spouse name: N/A  . Number of children: N/A  . Years of education: N/A   Social History Main Topics  . Smoking status: Current Every Day Smoker    Packs/day: 0.50    Years: 20.00  . Smokeless tobacco: Never Used  . Alcohol use No  . Drug use: No  . Sexual activity: Yes   Other Topics Concern  . Not on file   Social History Narrative  . No narrative on file    Allergies: No Known Allergies  Metabolic Disorder Labs: No results found for: HGBA1C, MPG No results found for: PROLACTIN No results found for: CHOL, TRIG, HDL, CHOLHDL, VLDL, LDLCALC No results found for: TSH  Therapeutic Level Labs: No results found for: LITHIUM No results found for: VALPROATE No components found for:  CBMZ  Current Medications: Current Outpatient Prescriptions  Medication Sig Dispense Refill  . atenolol (TENORMIN) 25 MG tablet Take 1 tablet (25 mg total) by mouth daily. 90 tablet 0  . carisoprodol (SOMA) 350 MG tablet Take 350 mg by mouth 2 (two) times daily as needed for muscle spasms.    . DULoxetine (CYMBALTA) 20 MG capsule Take 1 capsule daily for 1 week and than 2 daily 60 capsule 0  . gabapentin (NEURONTIN)  300 MG capsule Take 300 mg by mouth 3 (three) times daily.    . hydrochlorothiazide (HYDRODIURIL) 25 MG tablet TAKE 1 TABLET (25 MG TOTAL) BY MOUTH DAILY. 90 tablet 2   No current facility-administered medications for this visit.      Musculoskeletal: Strength & Muscle Tone: within normal limits Gait & Station: normal Patient leans: N/A  Psychiatric Specialty Exam: Review of Systems  Constitutional: Negative.   HENT: Negative.   Eyes: Negative.   Respiratory: Negative.   Cardiovascular: Negative.   Genitourinary: Negative.   Musculoskeletal: Positive for back pain and joint pain.  Skin: Negative.   Neurological: Positive for headaches.  Psychiatric/Behavioral: Positive for depression. The patient is nervous/anxious and has insomnia.     Blood pressure 140/76, pulse 90, height 5\' 8"  (1.727 m), weight 188 lb (85.3 kg).There is no height or weight on file to calculate BMI.  General Appearance: Casual  Eye Contact:  Fair  Speech:  Slow  Volume:  Normal  Mood:  Depressed and Dysphoric  Affect:  Constricted and Depressed  Thought Process:  Goal Directed  Orientation:  Full (Time, Place, and Person)  Thought Content: Rumination   Suicidal Thoughts:  No  Homicidal Thoughts:  No  Memory:  Immediate;   Fair Recent;   Good Remote;   Good  Judgement:  Fair  Insight:  Fair  Psychomotor Activity:  Decreased  Concentration:  Concentration: Fair and Attention Span: Fair  Recall:  Good  Fund of Knowledge: Good  Language: Good  Akathisia:  No  Handed:  Right  AIMS (if indicated): not done  Assets:  Communication Skills Desire for Improvement Resilience  ADL's:  Intact  Cognition: WNL  Sleep:  Fair   Screenings: PHQ2-9     Office Visit from 12/27/2015 in [facility]  PHQ-2 Total Score  0       Assessment and Plan: Major depressive disorder, recurrent.  Anxiety disorder NOS.  Reassurance given.  Discuss risk of 3 compliance with medication  follow-up.  Restart Cymbalta 20 mg daily for 1 week and than 40 mg daily since patient had a good response with Cymbalta.  I would also add low-dose trazodone to help the insomnia.  Encouraged to see a therapist in this office for coping and social skills.  She agreed with the plan.  Patient is normal or taking gabapentin we will discontinue that which was given by her primary care physician.  Discussed meditation side effects and benefits.  Recommended to call us back if she has any question, concern or if she feels worsening of the symptom.  Follow-up in 4 weeks.  Time spent 25 minutes.   Cailin Gebel T., MD 12/01/2016, 11:16 AM

## 2016-12-10 ENCOUNTER — Ambulatory Visit (HOSPITAL_COMMUNITY): Payer: Self-pay | Admitting: Licensed Clinical Social Worker

## 2016-12-18 ENCOUNTER — Ambulatory Visit (INDEPENDENT_AMBULATORY_CARE_PROVIDER_SITE_OTHER): Payer: 59 | Admitting: Psychiatry

## 2016-12-18 ENCOUNTER — Encounter (HOSPITAL_COMMUNITY): Payer: Self-pay | Admitting: Psychiatry

## 2016-12-18 DIAGNOSIS — M2550 Pain in unspecified joint: Secondary | ICD-10-CM

## 2016-12-18 DIAGNOSIS — F17210 Nicotine dependence, cigarettes, uncomplicated: Secondary | ICD-10-CM

## 2016-12-18 DIAGNOSIS — F330 Major depressive disorder, recurrent, mild: Secondary | ICD-10-CM

## 2016-12-18 DIAGNOSIS — F419 Anxiety disorder, unspecified: Secondary | ICD-10-CM

## 2016-12-18 DIAGNOSIS — Z634 Disappearance and death of family member: Secondary | ICD-10-CM

## 2016-12-18 DIAGNOSIS — R450 Nervousness: Secondary | ICD-10-CM | POA: Diagnosis not present

## 2016-12-18 MED ORDER — TRAZODONE HCL 50 MG PO TABS
50.0000 mg | ORAL_TABLET | Freq: Every day | ORAL | 2 refills | Status: DC
Start: 2016-12-18 — End: 2017-05-26

## 2016-12-18 MED ORDER — DULOXETINE HCL 20 MG PO CPEP: ORAL_CAPSULE | ORAL | 2 refills | Status: DC

## 2016-12-18 NOTE — Progress Notes (Signed)
BH MD/PA/NP OP Progress Note  12/18/2016 8:10 AM Carol Best  MRN:  [id number]  Chief Complaint: I am doing better with the medication.  HPI: Patient came for her follow-up appointment.  She is compliant with Cymbalta and trazodone and feeling much better.  Her sleep is improved from the past.  She is less anxious less depressed and less tearful.  She denies that she needed to take the medication every day.  She has no tremors shakes or any EPS.  She apologizes missing appointment with Shanda Bumps for counseling but like to reschedule again.  She's pleased that her son is doing very well and she was excited that he has a new girlfriend.  In the beginning she was upset because he did not inform her but now he is pleased because son is happy.  Patient admitted some anxiety because his ex-girlfriend continues to approach him to return belongings and gifts which was given to him.  Patient is hoping that her son returned these gifts to his ex-girlfriend.  Overall patient describes her mood is good.  She is going back to work but she need paperwork when she missed the work on September 10 and 11.  Her energy level is improved.  Today her blood pressure is high and she admitted noncompliant with her.  Pressure medication.  She has no chest pain, headache, shortness of breath or palpitation.  Patient denies drinking alcohol or using any illegal substances.  She lives with her husband and her daughter.  Visit Diagnosis:    ICD-10-CM   1. Mild episode of recurrent major depressive disorder (HCC) F33.0 traZODone (DESYREL) 50 MG tablet    DULoxetine (CYMBALTA) 20 MG capsule    Past Psychiatric History: Reviewed. Patient was briefly admitted at [facility] in 1999 when her mother was murdered by her boyfriend.  She remember having nervous breakdown but do not recall given any medication.  Past Medical History:  Past Medical History:  Diagnosis Date  . Hiatal hernia with GERD    Patient has sx  to remove  . Hypertension   . Urinary tract infection     Past Surgical History:  Procedure Laterality Date  . ABDOMINAL HYSTERECTOMY    . CHOLECYSTECTOMY    . SHOULDER ARTHROSCOPY WITH ROTATOR CUFF REPAIR      Family Psychiatric History: Reviewed.  Family History:  Family History  Problem Relation Age of Onset  . Diabetes Brother   . Hypertension Brother   . Hyperlipidemia Brother   . Cancer Neg Hx   . Stroke Neg Hx     Social History:  Social History   Social History  . Marital status: Married    Spouse name: N/A  . Number of children: N/A  . Years of education: N/A   Social History Main Topics  . Smoking status: Current Every Day Smoker    Packs/day: 0.50    Years: 20.00  . Smokeless tobacco: Never Used  . Alcohol use No  . Drug use: No  . Sexual activity: Yes   Other Topics Concern  . Not on file   Social History Narrative  . No narrative on file    Allergies: No Known Allergies  Metabolic Disorder Labs: No results found for: HGBA1C, MPG No results found for: PROLACTIN No results found for: CHOL, TRIG, HDL, CHOLHDL, VLDL, LDLCALC No results found for: TSH  Therapeutic Level Labs: No results found for: LITHIUM No results found for: VALPROATE No components found for:  CBMZ  Current Medications: Current Outpatient Prescriptions  Medication Sig Dispense Refill  . atenolol (TENORMIN) 25 MG tablet Take 1 tablet (25 mg total) by mouth daily. 90 tablet 0  . carisoprodol (SOMA) 350 MG tablet Take 350 mg by mouth 2 (two) times daily as needed for muscle spasms.    . DULoxetine (CYMBALTA) 20 MG capsule Take 1 capsule daily for 1 week and than 2 daily 60 capsule 0  . hydrochlorothiazide (HYDRODIURIL) 25 MG tablet TAKE 1 TABLET (25 MG TOTAL) BY MOUTH DAILY. 90 tablet 2  . traZODone (DESYREL) 50 MG tablet Take 1 tablet (50 mg total) by mouth at bedtime. 30 tablet 0   No current facility-administered medications for this visit.       Musculoskeletal: Strength & Muscle Tone: within normal limits Gait & Station: normal Patient leans: N/A  Psychiatric Specialty Exam: Review of Systems  Constitutional: Negative.   HENT: Negative.   Respiratory: Negative.   Cardiovascular: Negative.   Musculoskeletal: Positive for joint pain.  Skin: Negative.   Neurological: Negative.   Psychiatric/Behavioral: The patient is nervous/anxious.     Blood pressure (!) 157/94, pulse 81, temperature 99.2 F (37.3 C), temperature source Oral, resp. rate 17, height  (1.727 m), weight 188 lb 8 oz (85.5 kg), SpO2 98 %.There is no height or weight on file to calculate BMI.  General Appearance: Casual  Eye Contact:  Good  Speech:  Clear and Coherent  Volume:  Normal  Mood:  Anxious  Affect:  Congruent  Thought Process:  Goal Directed  Orientation:  Full (Time, Place, and Person)  Thought Content: Rumination   Suicidal Thoughts:  No  Homicidal Thoughts:  No  Memory:  Immediate;   Good Recent;   Good Remote;   Good  Judgement:  Good  Insight:  Good  Psychomotor Activity:  Normal  Concentration:  Concentration: Fair and Attention Span: Fair  Recall:  Good  Fund of Knowledge: Good  Language: Good  Akathisia:  No  Handed:  Right  AIMS (if indicated): not done  Assets:  Communication Skills Desire for Improvement Housing Resilience  ADL's:  Intact  Cognition: WNL  Sleep:  Good   Screenings: PHQ2-9     Office Visit from 12/27/2015 in [facility] Total Score  0       Assessment and Plan: Major depressive disorder, recurrent.  Anxiety disorder NOS.  Patient doing better since taking the medication on time.  She has no side effects.  Continue Cymbalta 20 mg twice a day and trazodone 50 mg at bedtime.  Encouraged to keep appointment with therapist for CBT.  Patient will require FMLA for September 10 and September 11 when she missed the work.  I also discuss that she should take her blood  pressure medication and if her blood pressure remains high then she should see her primary care physician.  Patient agreed with the plan.  Recommended to call us back if she has any question, concern or if she feels worsening of the symptom.  Follow-up in 3 months.   ARFEEN,SYED T., MD 12/18/2016, 8:10 AM

## 2016-12-22 ENCOUNTER — Ambulatory Visit (HOSPITAL_COMMUNITY): Payer: Self-pay | Admitting: Psychiatry

## 2016-12-28 ENCOUNTER — Encounter: Payer: Managed Care, Other (non HMO) | Admitting: Internal Medicine

## 2016-12-28 DIAGNOSIS — Z0289 Encounter for other administrative examinations: Secondary | ICD-10-CM

## 2017-01-13 ENCOUNTER — Ambulatory Visit (INDEPENDENT_AMBULATORY_CARE_PROVIDER_SITE_OTHER): Payer: 59 | Admitting: Licensed Clinical Social Worker

## 2017-01-13 ENCOUNTER — Encounter (HOSPITAL_COMMUNITY): Payer: Self-pay | Admitting: Licensed Clinical Social Worker

## 2017-01-13 VITALS — BP 140/92 | HR 67 | Ht 68.0 in | Wt 189.8 lb

## 2017-01-13 DIAGNOSIS — F930 Separation anxiety disorder of childhood: Secondary | ICD-10-CM | POA: Diagnosis not present

## 2017-01-13 DIAGNOSIS — F330 Major depressive disorder, recurrent, mild: Secondary | ICD-10-CM

## 2017-01-13 NOTE — Progress Notes (Signed)
Comprehensive Clinical Assessment (CCA) Note  01/13/2017 Carol Best [id number]  Visit Diagnosis:      ICD-10-CM   1. Separation anxiety disorder F93.0   2. Major depressive disorder, recurrent episode, mild (HCC) F33.0       CCA Part One  Part One has been completed on paper by the patient.  (See scanned document in Chart Review)  CCA Part Two A  Intake/Chief Complaint:  CCA Intake With Chief Complaint CCA Part Two Date: 01/13/17 CCA Part Two Time: 0811 Chief Complaint/Presenting Problem: 48 year old son joined the army in Feb 2018 overnight, no information given to [place]. He was going to college, but changed his mind by himself. While in [place], he sent letters to [place]. Adjustment problems. Had separation anxiety when son went into service. Crying spells, not able to work, not sleeping. Husband did not validate her feelings.   Patients Currently Reported Symptoms/Problems: Now doing better. Still having some anxiety. wants to rush through everything when anxiety hit.  Collateral Involvement: husband, brother Individual's Strengths: I love people, I love to talk, I love to solve problems Individual's Preferences: going to church, basketball games, visiting family Individual's Abilities: being a mom, great person to talk to, good at cooking Type of Services Patient Feels Are Needed: individual therapy, medication management Initial Clinical Notes/Concerns: history of trauma. Son was present when mother was murdered. Carol Best went through treatment for son's PTSD for years and was very overprotective of him. When he left for the Army without telling her, Carol Best felt unprepared and deeply hurt. She reports anxiety and worry has effected her sleep, mood, ability to function at work, and relationships with others.   Mental Health Symptoms Depression:  Depression: N/A  Mania:  Mania: N/A  Anxiety:   Anxiety: Worrying, Sleep, Restlessness, Irritability  Psychosis:  Psychosis: N/A   Trauma:  Trauma: Re-experience of traumatic event, Avoids reminders of event, Detachment from others, Emotional numbing, Hypervigilance, Guilt/shame  Obsessions:  Obsessions: N/A  Compulsions:  Compulsions: N/A  Inattention:   NA  Hyperactivity/Impulsivity:  Hyperactivity/Impulsivity: N/A  Oppositional/Defiant Behaviors:  Oppositional/Defiant Behaviors: N/A  Borderline Personality:  Emotional Irregularity: N/A  Other Mood/Personality Symptoms:   NA   Mental Status Exam Appearance and self-care  Stature:  Stature: Average  Weight:  Weight: Average weight  Clothing:  Clothing: Neat/clean  Grooming:  Grooming: Well-groomed  Cosmetic use:  Cosmetic Use: None  Posture/gait:  Posture/Gait: Normal  Motor activity:  Motor Activity: Not Remarkable  Sensorium  Attention:  Attention: Normal  Concentration:  Concentration: Normal  Orientation:  Orientation: X5  Recall/memory:  Recall/Memory: Normal  Affect and Mood  Affect:  Affect: Appropriate  Mood:  Mood: Euthymic  Relating  Eye contact:  Eye Contact: Normal  Facial expression:  Facial Expression: Responsive  Attitude toward examiner:  Attitude Toward Examiner: Cooperative  Thought and Language  Speech flow: Speech Flow: Normal  Thought content:  Thought Content: Appropriate to mood and circumstances  Preoccupation:  Preoccupations:  (NA)  Hallucinations:  Hallucinations:  (NA)  Organization:   NA  Company secretary of Knowledge:  Fund of Knowledge: Average  Intelligence:  Intelligence: Above Average  Abstraction:  Abstraction: Normal  Judgement:  Judgement: Normal  Reality Testing:  Reality Testing: Realistic  Insight:  Insight: Good  Decision Making:  Decision Making: Normal  Social Functioning  Social Maturity:  Social Maturity: Responsible  Social Judgement:  Social Judgement: Normal  Stress  Stressors:  Stressors: Family conflict  Coping Ability:  Coping  Ability: Resilient  Skill Deficits:   NA  Supports:   NA    Family and Psychosocial History: Family history Marital status: Married Number of Years Married: 8 What types of issues is patient dealing with in the relationship?: good marriage. Been together for 20 years Are you sexually active?: Yes What is your sexual orientation?: heterosexual Has your sexual activity been affected by drugs, alcohol, medication, or emotional stress?: effected by stress and also pre-menapausal Does patient have children?: Yes How many children?: 2 How is patient's relationship with their children?: 69 year old son, 33 year old daughter- very close  Childhood History:  Childhood History By whom was/is the patient raised?: Mother, Grandparents Additional childhood history information: mother and grandmother Description of patient's relationship with caregiver when they were a child: awesome relationship with mother and grandmother Patient's description of current relationship with people who raised him/her: mother was murdered 18 years ago by her boyfriend How were you disciplined when you got in trouble as a child/adolescent?: belt Does patient have siblings?: Yes Number of Siblings: 1 Description of patient's current relationship with siblings: 1 older brother- not as close as they were.  Did patient suffer any verbal/emotional/physical/sexual abuse as a child?: No Did patient suffer from severe childhood neglect?: No Has patient ever been sexually abused/assaulted/raped as an adolescent or adult?: No Was the patient ever a victim of a crime or a disaster?: No Witnessed domestic violence?: No Has patient been effected by domestic violence as an adult?: No  CCA Part Two B  Employment/Work Situation: Employment / Work Psychologist, occupational Employment situation: Employed Where is patient currently employed?: [employer]- [employer] and [employer] How long has patient been employed?: 6 years Patient's job has been impacted by current illness: Yes Describe how  patient's job has been impacted: tearfulness and anxiety when son went to the army What is the longest time patient has a held a job?: 6 years Where was the patient employed at that time?: current job Has patient ever been in the [employer]?: No Are There Guns or Other Weapons in Your Home?: No  Education: Education Did Garment/textile technologist From [employer]?: Yes Did Theme park manager?: No Did Designer, television/film set?: No Did You Have Any Scientist, research (life sciences) In School?: none Did You Have An Individualized Education Program (IIEP): No Did You Have Any Difficulty At [employer]?: No  Religion: Religion/Spirituality Are You A Religious Person?: Yes What is Your Religious Affiliation?: Holiness/Pentecostal How Might This Affect Treatment?: it will make it better  Leisure/Recreation: Leisure / Recreation Leisure and Hobbies: watch movies  Exercise/Diet: Exercise/Diet Do You Exercise?: No Have You Gained or Lost A Significant Amount of Weight in the Past Six Months?: Yes-Gained Number of Pounds Gained: 20 Do You Follow a Special Diet?: No Do You Have Any Trouble Sleeping?: No  CCA Part Two C  Alcohol/Drug Use: Alcohol / Drug Use Pain Medications: see MAR Prescriptions: see MAR Over the Counter: see MAR History of alcohol / drug use?: No history of alcohol / drug abuse                      CCA Part Three  ASAM's:  Six Dimensions of Multidimensional Assessment  Dimension 1:  Acute Intoxication and/or Withdrawal Potential:     Dimension 2:  Biomedical Conditions and Complications:     Dimension 3:  Emotional, Behavioral, or Cognitive Conditions and Complications:     Dimension 4:  Readiness to Change:  Dimension 5:  Relapse, Continued use, or Continued Problem Potential:     Dimension 6:  Recovery/Living Environment:      Substance use Disorder (SUD)    Social Function:  Social Functioning Social Maturity: Responsible Social Judgement: Normal  Stress:   Stress Stressors: Family conflict Coping Ability: Resilient Patient Takes Medications The Way The Doctor Instructed?: Yes Priority Risk: Low Acuity  Risk Assessment- Self-Harm Potential: Risk Assessment For Self-Harm Potential Thoughts of Self-Harm: No current thoughts Method: No plan Availability of Means: No access/NA  Risk Assessment -Dangerous to Others Potential: Risk Assessment For Dangerous to Others Potential Method: No Plan Availability of Means: No access or NA Intent: Vague intent or NA Notification Required: No need or identified person  DSM5 Diagnoses: Patient Active Problem List   Diagnosis Date Noted  . Essential hypertension 12/29/2015  . Chronic midline low back pain without sciatica 12/29/2015    Patient Centered Plan: Patient is on the following Treatment Plan(s):  Anxiety and Depression  Recommendations for Services/Supports/Treatments: Recommendations for Services/Supports/Treatments Recommendations For Services/Supports/Treatments: Individual Therapy, Medication Management  Treatment Plan Summary:    Referrals to Alternative Service(s): Referred to Alternative Service(s):   Place:   Date:   Time:    Referred to Alternative Service(s):   Place:   Date:   Time:    Referred to Alternative Service(s):   Place:   Date:   Time:    Referred to Alternative Service(s):   Place:   Date:   Time:     Veneda MelterJessica R Sobia Karger, LCSW

## 2017-01-13 NOTE — Progress Notes (Signed)
   THERAPIST PROGRESS NOTE  Session Time: 8:00am-9:00am  Participation Level: Active  Behavioral Response: Well GroomedAlertEuthymic  Type of Therapy: Individual Therapy  Treatment Goals addressed: Anxiety and Coping  Interventions: CBT and Motivational Interviewing  Summary: Carol Best is a 46 y.o. female who presents with Separation Anxiety Disorder and Major Depressive Disorder, recurrent episode, mild.   Suicidal/Homicidal: Nowithout intent/plan  Therapist Response: Kasandra engaged well in CCA. She reports increased anxiety after her son left home to join the army in February. Melina SchoolsRobyn explained her experiences raising her son following the significant trauma of her mother being murdered. Melina SchoolsRobyn reports her experience coping with trauma has improved and she does not meet full criteria for PTSD at this time. However, this will be continually assessed. Bellamy reports high levels of Separation Anxiety.   Plan: Return again in 2-3 weeks.  Diagnosis: Axis I: Separation Anxiety Disorder and Major Depressive Disorder, recurrent episode, mild      Veneda MelterJessica R Schlosberg, LCSW 01/13/2017

## 2017-04-02 ENCOUNTER — Ambulatory Visit (HOSPITAL_COMMUNITY): Payer: Self-pay | Admitting: Psychiatry

## 2017-05-07 ENCOUNTER — Ambulatory Visit (HOSPITAL_COMMUNITY): Payer: Self-pay | Admitting: Psychiatry

## 2017-05-26 ENCOUNTER — Ambulatory Visit (INDEPENDENT_AMBULATORY_CARE_PROVIDER_SITE_OTHER): Payer: 59 | Admitting: Psychiatry

## 2017-05-26 ENCOUNTER — Encounter (HOSPITAL_COMMUNITY): Payer: Self-pay | Admitting: Psychiatry

## 2017-05-26 DIAGNOSIS — M549 Dorsalgia, unspecified: Secondary | ICD-10-CM

## 2017-05-26 DIAGNOSIS — F330 Major depressive disorder, recurrent, mild: Secondary | ICD-10-CM | POA: Diagnosis not present

## 2017-05-26 DIAGNOSIS — F419 Anxiety disorder, unspecified: Secondary | ICD-10-CM

## 2017-05-26 DIAGNOSIS — M7550 Bursitis of unspecified shoulder: Secondary | ICD-10-CM | POA: Insufficient documentation

## 2017-05-26 DIAGNOSIS — G4700 Insomnia, unspecified: Secondary | ICD-10-CM

## 2017-05-26 DIAGNOSIS — M25519 Pain in unspecified shoulder: Secondary | ICD-10-CM | POA: Insufficient documentation

## 2017-05-26 DIAGNOSIS — F17210 Nicotine dependence, cigarettes, uncomplicated: Secondary | ICD-10-CM | POA: Diagnosis not present

## 2017-05-26 DIAGNOSIS — M75120 Complete rotator cuff tear or rupture of unspecified shoulder, not specified as traumatic: Secondary | ICD-10-CM | POA: Insufficient documentation

## 2017-05-26 MED ORDER — DULOXETINE HCL 30 MG PO CPEP: ORAL_CAPSULE | ORAL | 1 refills | Status: DC

## 2017-05-26 MED ORDER — TRAZODONE HCL 100 MG PO TABS: 100.0000 mg | ORAL_TABLET | Freq: Every day | ORAL | 1 refills | Status: DC

## 2017-05-26 NOTE — Progress Notes (Signed)
BH MD/PA/NP OP Progress Note  05/26/2017 12:49 PM Carol Charissa BashLynn Gin  MRN:  [id number]  Chief Complaint: I have been a lot of stress.  My brother died after the complication of abdominal surgery.  HPI: Patient came for her follow-up appointment.  Her brother died last week of January after having complication of the abdominal surgery.  Patient regret that she did not spend too much time with the patient when he was admitted.  She endorsed having crying spells, fatigue, poor sleep, anhedonia, feeling sad and depressed.  She also admitted not able to see Shanda BumpsJessica because she thought that she was doing good and she canceled the appointment.  Now she realized that she need to see therapist.  She mentioned her son is doing good but he lives in [place] and does not see him regularly.  Her 49 year old daughter lives with the patient.  Patient admitted lack of energy and fatigue and she had missed a few days from the work.  Patient has FMLA.  Patient admitted since taking the medication her depression has been much better but due to recent loss of the brother she feels sleeping again into depression.  Patient denies any paranoia, suicidal thoughts or homicidal thought.  She denies any agitation, anger or any mood swings.  Patient denies drinking alcohol or using any illegal substances.  She is sleeping on and off.  Her appetite is okay.  Her vital signs are stable.  Visit Diagnosis:    ICD-10-CM   1. Mild episode of recurrent major depressive disorder (HCC) F33.0 DULoxetine (CYMBALTA) 30 MG capsule    traZODone (DESYREL) 100 MG tablet    Past Psychiatric History: Reviewed. Patient was briefly admitted at [facility] in 1999 when her mother was murdered by her boyfriend. She remember having nervous breakdown but do not recall given any medication.  Past Medical History:  Past Medical History:  Diagnosis Date  . Hiatal hernia with GERD    Patient has sx to remove  . Hypertension   . Urinary tract  infection     Past Surgical History:  Procedure Laterality Date  . ABDOMINAL HYSTERECTOMY    . CHOLECYSTECTOMY    . SHOULDER ARTHROSCOPY WITH ROTATOR CUFF REPAIR      Family Psychiatric History: Reviewed.  Family History:  Family History  Problem Relation Age of Onset  . Diabetes Brother   . Hypertension Brother   . Hyperlipidemia Brother   . Cancer Neg Hx   . Stroke Neg Hx     Social History:  Social History   Socioeconomic History  . Marital status: Married    Spouse name: Not on file  . Number of children: Not on file  . Years of education: Not on file  . Highest education level: Not on file  Social Needs  . Financial resource strain: Not on file  . Food insecurity - worry: Not on file  . Food insecurity - inability: Not on file  . Transportation needs - medical: Not on file  . Transportation needs - non-medical: Not on file  Occupational History  . Not on file  Tobacco Use  . Smoking status: Current Every Day Smoker    Packs/day: 0.50    Years: 20.00    Pack years: 10.00  . Smokeless tobacco: Never Used  Substance and Sexual Activity  . Alcohol use: No  . Drug use: No  . Sexual activity: Yes  Other Topics Concern  . Not on file  Social History Narrative  .  Not on file    Allergies: No Known Allergies  Metabolic Disorder Labs: No results found for: HGBA1C, MPG No results found for: PROLACTIN No results found for: CHOL, TRIG, HDL, CHOLHDL, VLDL, LDLCALC No results found for: TSH  Therapeutic Level Labs: No results found for: LITHIUM No results found for: VALPROATE No components found for:  CBMZ  Current Medications: Current Outpatient Medications  Medication Sig Dispense Refill  . atenolol (TENORMIN) 25 MG tablet Take 1 tablet (25 mg total) by mouth daily. 90 tablet 0  . carisoprodol (SOMA) 350 MG tablet Take 350 mg by mouth 2 (two) times daily as needed for muscle spasms.    . DULoxetine (CYMBALTA) 20 MG capsule Take 1 capsule twice daily 60  capsule 2  . hydrochlorothiazide (HYDRODIURIL) 25 MG tablet TAKE 1 TABLET (25 MG TOTAL) BY MOUTH DAILY. 90 tablet 2  . traZODone (DESYREL) 50 MG tablet Take 1 tablet (50 mg total) by mouth at bedtime. 30 tablet 2   No current facility-administered medications for this visit.      Musculoskeletal: Strength & Muscle Tone: within normal limits Gait & Station: normal Patient leans: N/A  Psychiatric Specialty Exam: Review of Systems  Constitutional: Positive for malaise/fatigue.  HENT: Negative.   Respiratory: Negative.   Musculoskeletal: Positive for back pain.  Skin: Negative.   Neurological: Negative.   Psychiatric/Behavioral: Positive for depression. The patient has insomnia.     Blood pressure 138/86, pulse 76, height 5\' 8"  (1.727 m), weight 197 lb (89.4 kg).There is no height or weight on file to calculate BMI.  General Appearance: Casual  Eye Contact:  Good  Speech:  Clear and Coherent  Volume:  Normal  Mood:  Depressed and Dysphoric  Affect:  Constricted  Thought Process:  Goal Directed  Orientation:  Full (Time, Place, and Person)  Thought Content: Rumination   Suicidal Thoughts:  No  Homicidal Thoughts:  No  Memory:  Immediate;   Fair Recent;   Fair Remote;   Fair  Judgement:  Good  Insight:  Good  Psychomotor Activity:  Decreased  Concentration:  Concentration: Fair and Attention Span: Fair  Recall:  Good  Fund of Knowledge: Good  Language: Good  Akathisia:  No  Handed:  Right  AIMS (if indicated): not done  Assets:  Communication Skills Desire for Improvement Housing  ADL's:  Intact  Cognition: WNL  Sleep:  Poor   Screenings: PHQ2-9     Office Visit from 12/27/2015 in [facility] Total Score  0       Assessment and Plan: Major depressive disorder, recurrent.  Anxiety disorder NOS.  Discussed grief counseling.  Patient like to go back to see Shanda Bumps for CBT and grief counseling.  I recommended to try Cymbalta 30 mg  twice a day and increase trazodone 100 mg at bedtime.  Patient requesting FMLA to be completed so in the future she can take days off when she is very depressed.  We will do FMLA papers.  Discussed medication side effects especially GI and tremors.  Discussed safety concerns at any time having active suicidal thoughts or homicidal thought that she need to call 911 or go to local emergency room.  I will see her again in 2 months.  Time spent 25 minutes.  More than 50% of the time spent in psychoeducation, counseling, coordination of care and long-term prognosis.   Cleotis Nipper, MD 05/26/2017, 12:49 PM

## 2017-07-06 ENCOUNTER — Ambulatory Visit (HOSPITAL_COMMUNITY): Payer: 59 | Admitting: Licensed Clinical Social Worker

## 2017-07-30 ENCOUNTER — Ambulatory Visit (HOSPITAL_COMMUNITY): Payer: 59 | Admitting: Psychiatry

## 2017-10-11 ENCOUNTER — Other Ambulatory Visit: Payer: Self-pay | Admitting: Internal Medicine

## 2017-10-30 ENCOUNTER — Other Ambulatory Visit (HOSPITAL_COMMUNITY): Payer: Self-pay | Admitting: Psychiatry

## 2017-10-30 DIAGNOSIS — F330 Major depressive disorder, recurrent, mild: Secondary | ICD-10-CM

## 2017-11-06 ENCOUNTER — Other Ambulatory Visit (HOSPITAL_COMMUNITY): Payer: Self-pay | Admitting: Psychiatry

## 2017-11-06 DIAGNOSIS — F330 Major depressive disorder, recurrent, mild: Secondary | ICD-10-CM

## 2018-03-02 ENCOUNTER — Ambulatory Visit (INDEPENDENT_AMBULATORY_CARE_PROVIDER_SITE_OTHER): Payer: Commercial Managed Care - PPO

## 2018-03-02 ENCOUNTER — Other Ambulatory Visit: Payer: Self-pay | Admitting: Podiatry

## 2018-03-02 ENCOUNTER — Ambulatory Visit: Payer: Commercial Managed Care - PPO | Admitting: Podiatry

## 2018-03-02 ENCOUNTER — Encounter: Payer: Self-pay | Admitting: Podiatry

## 2018-03-02 DIAGNOSIS — M205X2 Other deformities of toe(s) (acquired), left foot: Secondary | ICD-10-CM

## 2018-03-02 DIAGNOSIS — M79672 Pain in left foot: Secondary | ICD-10-CM

## 2018-03-02 DIAGNOSIS — M722 Plantar fascial fibromatosis: Secondary | ICD-10-CM | POA: Diagnosis not present

## 2018-03-02 DIAGNOSIS — M79671 Pain in right foot: Secondary | ICD-10-CM

## 2018-03-02 DIAGNOSIS — M205X9 Other deformities of toe(s) (acquired), unspecified foot: Secondary | ICD-10-CM

## 2018-03-02 DIAGNOSIS — M205X1 Other deformities of toe(s) (acquired), right foot: Secondary | ICD-10-CM | POA: Diagnosis not present

## 2018-03-02 DIAGNOSIS — L600 Ingrowing nail: Secondary | ICD-10-CM

## 2018-03-02 MED ORDER — TRIAMCINOLONE ACETONIDE 10 MG/ML IJ SUSP
10.0000 mg | Freq: Once | INTRAMUSCULAR | Status: AC
  Administered 2018-03-02: 10 mg

## 2018-03-02 MED ORDER — DICLOFENAC SODIUM 75 MG PO TBEC: 75.0000 mg | DELAYED_RELEASE_TABLET | Freq: Two times a day (BID) | ORAL | 2 refills | Status: DC

## 2018-03-02 MED ORDER — NEOMYCIN-POLYMYXIN-HC 3.5-10000-1 OT SOLN: OTIC | 1 refills | Status: DC

## 2018-03-02 NOTE — Patient Instructions (Signed)

## 2018-03-02 NOTE — Progress Notes (Signed)
Subjective:   Patient ID: Carol Best, female   DOB: 47 y.o.   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  Patient smokes half pack per day and likes to be active   Review of Systems  All other systems reviewed and are negative.       Objective:  Physical Exam  Constitutional: She appears well-developed and well-nourished.  Cardiovascular: Intact distal pulses.  Pulmonary/Chest: Effort normal.  Musculoskeletal: Normal range of motion.  Neurological: She is alert.  Skin: Skin is warm.  Nursing note and vitals reviewed.   Neurovascular status found to be intact muscle strength is adequate range of motion within normal limits with patient found to have exquisite discomfort left plantar fascial insertional point tendon calcaneus significant range of motion loss with improved big toe joint right over lower and incurvated medial border right hallux with pain and deformity with no redness or drainage noted.  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  I recommended removal of the nail corner allow patient to read consent form which she signed after review and today I infiltrated the right hallux 60 mg like Marcaine mixture sterile prep applied to the toe remove the medial border exposed matrix and applied phenol 3 applications 30 seconds followed by alcohol lavage sterile dressing and for the heel I went ahead and I injected the plantar fascia 3 mg Kenalog 5 mg Xylocaine and applied fascial brace.  Discussed hallux  limitus she wants to have surgery but is getting need to be next year and we will discuss this when she comes in for her follow-up visit  X-rays indicate small spur formation left and indicated that there is reduction of motion first MPJ bilateral with reduced joint space and spur formation bilateral.  Also wrote prescription for diclofenac and Corticosporin otic solution for nail

## 2018-03-18 ENCOUNTER — Ambulatory Visit: Payer: Self-pay | Admitting: Podiatry

## 2018-03-21 ENCOUNTER — Telehealth: Payer: Self-pay | Admitting: Podiatry

## 2018-03-21 NOTE — Telephone Encounter (Signed)
X-rays were burned to disc and placed up front.

## 2018-03-21 NOTE — Telephone Encounter (Signed)
Patient would like a copy of radiology disc. Pt is aware of fee and she needs to sign medical release form. Patient will pick disc up on 04/24/18 around 12noon.

## 2018-03-24 DIAGNOSIS — M79676 Pain in unspecified toe(s): Secondary | ICD-10-CM

## 2018-03-30 ENCOUNTER — Ambulatory Visit: Payer: Commercial Managed Care - PPO | Admitting: Podiatry

## 2018-03-30 ENCOUNTER — Encounter: Payer: Self-pay | Admitting: Podiatry

## 2018-03-30 DIAGNOSIS — M205X9 Other deformities of toe(s) (acquired), unspecified foot: Secondary | ICD-10-CM | POA: Diagnosis not present

## 2018-03-30 DIAGNOSIS — L600 Ingrowing nail: Secondary | ICD-10-CM

## 2018-03-30 DIAGNOSIS — M722 Plantar fascial fibromatosis: Secondary | ICD-10-CM

## 2018-03-30 NOTE — Progress Notes (Signed)
Subjective:   Patient ID: Carol Best, female   DOB: 48 y.o.   MRN: [id number]   HPI Patient presents with severe discomfort big toe joint right over left and significant discomfort plantar aspect left heel.  Ingrown toenail that was corrected is doing much better with no discomfort and she states she knows she needs surgery but she is trying to hold off currently due to a relatively new job but knows that she is not can to be able to last that long   ROS      Objective:  Physical Exam  Neurovascular status intact with patient found to have significant limitation of motion first MPJ right over left with pain in the joint surface and exquisite discomfort plantar aspect left heel at the insertional point tendon into the calcaneus     Assessment:  Hallux rigidus deformity right over left with inflammation of the joint and plantar fasciitis left and well-healed ingrown toenail site right     Plan:  H&P conditions reviewed at great length.  I do think we could do surgery and get her into a boot with a graphite toe protector and maybe she can return to work in 4 to 6 weeks versus 8-12.  I did discuss osteotomy the first metatarsal and also injection treatment and she wants to hold off talk to work and then decide about surgery for this particular condition.  This is a very difficult problem and there is no guarantee long-term and will not require fusion or joint implant procedure

## 2018-04-03 ENCOUNTER — Other Ambulatory Visit (HOSPITAL_COMMUNITY): Payer: Self-pay | Admitting: Psychiatry

## 2018-04-03 DIAGNOSIS — F330 Major depressive disorder, recurrent, mild: Secondary | ICD-10-CM

## 2018-05-26 ENCOUNTER — Ambulatory Visit: Payer: Commercial Managed Care - PPO | Admitting: Podiatry

## 2018-05-26 ENCOUNTER — Encounter: Payer: Self-pay | Admitting: Podiatry

## 2018-05-26 DIAGNOSIS — M722 Plantar fascial fibromatosis: Secondary | ICD-10-CM

## 2018-05-26 DIAGNOSIS — M205X9 Other deformities of toe(s) (acquired), unspecified foot: Secondary | ICD-10-CM

## 2018-05-26 MED ORDER — DICLOFENAC SODIUM 75 MG PO TBEC: 75.0000 mg | DELAYED_RELEASE_TABLET | Freq: Two times a day (BID) | ORAL | 2 refills | Status: DC

## 2018-05-26 MED ORDER — TRAMADOL HCL 50 MG PO TABS: 50.0000 mg | ORAL_TABLET | Freq: Three times a day (TID) | ORAL | 2 refills | Status: DC

## 2018-05-26 MED ORDER — TRIAMCINOLONE ACETONIDE 10 MG/ML IJ SUSP
10.0000 mg | Freq: Once | INTRAMUSCULAR | Status: AC
  Administered 2018-05-26: 10 mg

## 2018-05-27 ENCOUNTER — Telehealth: Payer: Self-pay | Admitting: *Deleted

## 2018-05-27 NOTE — Telephone Encounter (Signed)
Pt states the antiinflammatory was at her pharmacy, but not the tramadol. I told pt the tramadol was ordered but needs to be called to the pharmacy, I would do that right now.

## 2018-05-27 NOTE — Telephone Encounter (Signed)
Tramadol orders of 05/26/2018 were called to the [id number].

## 2018-05-29 NOTE — Progress Notes (Signed)
Subjective:   Patient ID: Carol Best, female   DOB: 48 y.o.   MRN: [id number]   HPI Patient presents stating she still having intense discomfort in her left heel and in her big toe joint right is also really hurting but she cannot take off work currently   ROS      Objective:  Physical Exam  Neurovascular status intact with intense discomfort left plantar fascia at the insertion of the tendon into the calcaneus and with significant range of motion loss first MPJ right     Assessment:  Continue plantar fasciitis-like symptoms left with patient works on cement floors along with severe range of motion loss first MPJ with chronic hallux limitus     Plan:  H&P and both conditions reviewed.  At this point I did go ahead and I reinjected the fascia left 3 mg Kenalog 5 g Xylocaine and applied air fracture walker to completely immobilize and she will wear this is much as possible.  I then went ahead and I discussed orthotics to try to provide better support good also to try to reduce some of the stress against the big toe joint right which will need to be corrected but she simply cannot do at the current time.  Patient is referred to ped orthotist for this and will have an orthotic with some kind of extension around the big toe joint right to try to take pressure off the joint surface

## 2018-06-14 ENCOUNTER — Other Ambulatory Visit: Payer: Commercial Managed Care - PPO | Admitting: Orthotics

## 2018-07-07 ENCOUNTER — Other Ambulatory Visit: Payer: Commercial Managed Care - PPO | Admitting: Orthotics

## 2018-07-07 ENCOUNTER — Ambulatory Visit: Payer: Commercial Managed Care - PPO | Admitting: Podiatry

## 2018-12-23 ENCOUNTER — Telehealth: Payer: Self-pay | Admitting: *Deleted

## 2018-12-23 NOTE — Telephone Encounter (Signed)
Refilled prescription for:  Diclofenac SOD EC 75 mg Dispensed 50 with 0 refills  Prescription was sent to:  [facility] [address], [place] [address]  For further refills, patient is to come back and be seen by Dr. Paulla Dolly.

## 2019-01-25 ENCOUNTER — Encounter: Payer: Self-pay | Admitting: Emergency Medicine

## 2019-01-25 ENCOUNTER — Other Ambulatory Visit: Payer: Self-pay

## 2019-01-25 ENCOUNTER — Ambulatory Visit
Admission: EM | Admit: 2019-01-25 | Discharge: 2019-01-25 | Disposition: A | Discharge status: Home / Self Care | Payer: Commercial Managed Care - PPO | Attending: Family Medicine | Admitting: Family Medicine

## 2019-01-25 DIAGNOSIS — J069 Acute upper respiratory infection, unspecified: Secondary | ICD-10-CM

## 2019-01-25 DIAGNOSIS — R062 Wheezing: Secondary | ICD-10-CM

## 2019-01-25 MED ORDER — PREDNISONE 10 MG PO TABS: ORAL_TABLET | ORAL | 0 refills | Status: DC

## 2019-01-25 MED ORDER — ALBUTEROL SULFATE HFA 108 (90 BASE) MCG/ACT IN AERS: 1.0000 | INHALATION_SPRAY | Freq: Four times a day (QID) | RESPIRATORY_TRACT | 0 refills | Status: DC | PRN

## 2019-01-25 MED ORDER — BENZONATATE 200 MG PO CAPS: 200.0000 mg | ORAL_CAPSULE | Freq: Three times a day (TID) | ORAL | 0 refills | Status: DC | PRN

## 2019-01-25 MED ORDER — DOXYCYCLINE HYCLATE 100 MG PO TABS: 100.0000 mg | ORAL_TABLET | Freq: Two times a day (BID) | ORAL | 0 refills | Status: DC

## 2019-01-25 NOTE — ED Provider Notes (Signed)
[facility]    CSN: [id number] Arrival date & time: 01/25/19  1120      History   Chief Complaint Chief Complaint  Patient presents with  . Cough  . Wheezing  . Nasal Congestion    HPI Carol Best is a 48 y.o. female.   48 yo female with a c/o cough, wheezing, cough, nasal congestion, for the past 4 days. Denies any fevers, chills, shortness of breath. Patient is a chronic smoker.    Cough Associated symptoms: wheezing   Wheezing Associated symptoms: cough     Past Medical History:  Diagnosis Date  . Hiatal hernia with GERD    Patient has sx to remove  . Hypertension   . Urinary tract infection     Patient Active Problem List   Diagnosis Date Noted  . Shoulder pain 05/26/2017  . Full thickness rotator cuff tear 05/26/2017  . Bursitis of shoulder 05/26/2017  . Essential hypertension 12/29/2015  . Chronic midline low back pain without sciatica 12/29/2015    Past Surgical History:  Procedure Laterality Date  . ABDOMINAL HYSTERECTOMY    . CHOLECYSTECTOMY    . SHOULDER ARTHROSCOPY WITH ROTATOR CUFF REPAIR      OB History   No obstetric history on file.      Home Medications    Prior to Admission medications   Medication Sig Start Date End Date Taking? Authorizing Provider  atenolol (TENORMIN) 25 MG tablet Take 1 tablet (25 mg total) by mouth daily. 12/27/15  Yes Baity, Salvadore Oxfordegina W, NP  fluticasone (FLONASE) 50 MCG/ACT nasal spray fluticasone propionate 50 mcg/actuation nasal spray,suspension   Yes [provider]  hydrochlorothiazide (HYDRODIURIL) 25 MG tablet TAKE 1 TABLET (25 MG TOTAL) BY MOUTH DAILY. 03/27/16  Yes Baity, Salvadore Oxfordegina W, NP  albuterol (VENTOLIN HFA) 108 (90 Base) MCG/ACT inhaler Inhale 1-2 puffs into the lungs every 6 (six) hours as needed for wheezing or shortness of breath. 01/25/19   Payton Mccallumonty, Tajah Noguchi, MD  amLODipine (NORVASC) 5 MG tablet Take 25 mg by mouth daily.     [provider]  benzonatate (TESSALON) 200  MG capsule Take 1 capsule (200 mg total) by mouth 3 (three) times daily as needed. 01/25/19   Payton Mccallumonty, Cruz Bong, MD  diclofenac (VOLTAREN) 75 MG EC tablet Take 1 tablet (75 mg total) by mouth 2 (two) times daily. 03/02/18   Lenn Sinkegal, Norman S, DPM  diclofenac (VOLTAREN) 75 MG EC tablet Take 1 tablet (75 mg total) by mouth 2 (two) times daily. 05/26/18   Lenn Sinkegal, Norman S, DPM  diclofenac sodium (VOLTAREN) 1 % GEL  03/25/18   [provider]  doxycycline (VIBRA-TABS) 100 MG tablet Take 1 tablet (100 mg total) by mouth 2 (two) times daily. 01/25/19   Payton Mccallumonty, Aniston Christman, MD  predniSONE (DELTASONE) 10 MG tablet Start 60 mg po day one, then 50 mg po day two, taper by 10 mg daily until complete. 01/25/19   Payton Mccallumonty, Mariaeduarda Defranco, MD  traMADol (ULTRAM) 50 MG tablet Take 1 tablet (50 mg total) by mouth 3 (three) times daily. 05/26/18   Lenn Sinkegal, Norman S, DPM    Family History Family History  Problem Relation Age of Onset  . Diabetes Brother   . Hypertension Brother   . Hyperlipidemia Brother   . Cancer Neg Hx   . Stroke Neg Hx     Social History Social History   Tobacco Use  . Smoking status: Current Every Day Smoker    Packs/day: 0.50    Years:  20.00    Pack years: 10.00  . Smokeless tobacco: Never Used  Substance Use Topics  . Alcohol use: No  . Drug use: No     Allergies   Diclofenac   Review of Systems Review of Systems  Respiratory: Positive for cough and wheezing.      Physical Exam Triage Vital Signs ED Triage Vitals  Enc Vitals Group     BP 01/25/19 1138 (!) 146/82     Pulse Rate 01/25/19 1138 78     Resp 01/25/19 1138 18     Temp 01/25/19 1138 98.9 F (37.2 C)     Temp Source 01/25/19 1138 Oral     SpO2 01/25/19 1138 97 %     Weight 01/25/19 1136 200 lb (90.7 kg)     Height 01/25/19 1136 5\' 8"  (1.727 m)     Head Circumference --      Peak Flow --      Pain Score 01/25/19 1136 0     Pain Loc --      Pain Edu? --      Excl. in [place]? --    No data found.  Updated Vital Signs  BP (!) 146/82 (BP Location: Right Arm)   Pulse 78   Temp 98.9 F (37.2 C) (Oral)   Resp 18   Ht 5\' 8"  (1.727 m)   Wt 90.7 kg   SpO2 97%   BMI 30.41 kg/m   Visual Acuity Right Eye Distance:   Left Eye Distance:   Bilateral Distance:    Right Eye Near:   Left Eye Near:    Bilateral Near:     Physical Exam Vitals signs reviewed.  Constitutional:      General: She is not in acute distress.    Appearance: She is not toxic-appearing or diaphoretic.  Cardiovascular:     Rate and Rhythm: Normal rate and regular rhythm.     Heart sounds: Normal heart sounds.  Pulmonary:     Effort: Pulmonary effort is normal. No respiratory distress.     Breath sounds: No stridor. Wheezing (mild, diffuse ) and rhonchi (mild, diffuse) present. No rales.  Neurological:     Mental Status: She is alert.      UC Treatments / Results  Labs (all labs ordered are listed, but only abnormal results are displayed) Labs Reviewed  NOVEL CORONAVIRUS, NAA (HOSP ORDER, SEND-OUT TO REF LAB; TAT 18-24 HRS)    EKG   Radiology No results found.  Procedures Procedures (including critical care time)  Medications Ordered in UC Medications - No data to display  Initial Impression / Assessment and Plan / UC Course  I have reviewed the triage vital signs and the nursing notes.  Pertinent labs & imaging results that were available during my care of the patient were reviewed by me and considered in my medical decision making (see chart for details).      Final Clinical Impressions(s) / UC Diagnoses   Final diagnoses:  Viral URI with cough  Wheezing    ED Prescriptions    Medication Sig Dispense Auth. Provider   albuterol (VENTOLIN HFA) 108 (90 Base) MCG/ACT inhaler Inhale 1-2 puffs into the lungs every 6 (six) hours as needed for wheezing or shortness of breath. 8 g Norval Gable, MD   predniSONE (DELTASONE) 10 MG tablet Start 60 mg po day one, then 50 mg po day two, taper by 10 mg daily until  complete. 21 tablet Norval Gable, MD   benzonatate (  TESSALON) 200 MG capsule Take 1 capsule (200 mg total) by mouth 3 (three) times daily as needed. 30 capsule Payton Mccallum, MD   doxycycline (VIBRA-TABS) 100 MG tablet Take 1 tablet (100 mg total) by mouth 2 (two) times daily. 20 tablet Payton Mccallum, MD      1. diagnosis reviewed with patient 2. rx as per orders above; reviewed possible side effects, interactions, risks and benefits  3. Recommend supportive treatment with rest, fluids 4. covid test done 5. Follow-up prn if symptoms worsen or don't improve  I have reviewed the PDMP during this encounter.   Payton Mccallum, MD 01/25/19 2010

## 2019-01-25 NOTE — ED Triage Notes (Signed)
Patient c/o not feeling well x 4 days. Husband was tested for COVID on Monday and was negative.  She is c/o cough and wheezing, nasal congestion, right ear pain x 4 days. Denies fever. She does report that she smokes.

## 2019-01-26 LAB — NOVEL CORONAVIRUS, NAA (HOSP ORDER, SEND-OUT TO REF LAB; TAT 18-24 HRS): SARS-CoV-2, NAA: NOT DETECTED

## 2019-03-01 ENCOUNTER — Other Ambulatory Visit: Payer: Self-pay

## 2019-03-01 DIAGNOSIS — Z20822 Contact with and (suspected) exposure to covid-19: Secondary | ICD-10-CM

## 2019-03-02 LAB — NOVEL CORONAVIRUS, NAA: SARS-CoV-2, NAA: NOT DETECTED

## 2020-04-09 ENCOUNTER — Ambulatory Visit
Admission: EM | Admit: 2020-04-09 | Discharge: 2020-04-09 | Disposition: A | Discharge status: Home / Self Care | Payer: Commercial Managed Care - PPO | Attending: Family Medicine | Admitting: Family Medicine

## 2020-04-09 ENCOUNTER — Other Ambulatory Visit: Payer: Self-pay

## 2020-04-09 DIAGNOSIS — Z20822 Contact with and (suspected) exposure to covid-19: Secondary | ICD-10-CM | POA: Diagnosis not present

## 2020-04-09 DIAGNOSIS — U071 COVID-19: Secondary | ICD-10-CM | POA: Insufficient documentation

## 2020-04-09 DIAGNOSIS — R52 Pain, unspecified: Secondary | ICD-10-CM | POA: Diagnosis not present

## 2020-04-09 DIAGNOSIS — R059 Cough, unspecified: Secondary | ICD-10-CM | POA: Diagnosis not present

## 2020-04-09 DIAGNOSIS — B349 Viral infection, unspecified: Secondary | ICD-10-CM | POA: Diagnosis present

## 2020-04-09 DIAGNOSIS — I10 Essential (primary) hypertension: Secondary | ICD-10-CM | POA: Diagnosis present

## 2020-04-09 MED ORDER — PROMETHAZINE-DM 6.25-15 MG/5ML PO SYRP: 5.0000 mL | ORAL_SOLUTION | Freq: Every day | ORAL | 0 refills | Status: AC

## 2020-04-09 MED ORDER — GUAIFENESIN 100 MG/5ML PO SYRP: 100.0000 mg | ORAL_SOLUTION | ORAL | 0 refills | Status: AC | PRN

## 2020-04-09 NOTE — ED Triage Notes (Signed)
Pt has not taken BP medication this morning

## 2020-04-09 NOTE — Discharge Instructions (Addendum)
Take Mucinex during the day and promethazine DM at bed time. Cannot give you narcotic containing cough syrups since you take oxycodone already.  You have received COVID testing today either for positive exposure, concerning symptoms that could be related to COVID infection, screening purposes, or re-testing after confirmed positive.  Your test obtained today checks for active viral infection in the last 1-2 weeks. If your test is negative now, you can still test positive later. So, if you do develop symptoms you should either get re-tested and/or isolate x 5 days and then strict mask use x 5 days (unvaccinated) or mask use x 10 days (vaccinated). Please follow CDC guidelines.  While Rapid antigen tests come back in 15-20 minutes, send out PCR/molecular test results typically come back within 1-3 days. In the mean time, if you are symptomatic, assume this could be a positive test and treat/monitor yourself as if you do have COVID.   We will call with test results if positive. Please download the MyChart app and set up a profile to access test results.   If symptomatic, go home and rest. Push fluids. Take Tylenol as needed for discomfort. Gargle warm salt water. Throat lozenges. Take Mucinex DM or Robitussin for cough. Humidifier in bedroom to ease coughing. Warm showers. Also review the COVID handout for more information.  COVID-19 INFECTION: The incubation period of COVID-19 is approximately 14 days after exposure, with most symptoms developing in roughly 4-5 days. Symptoms may range in severity from mild to critically severe. Roughly 80% of those infected will have mild symptoms. People of any age may become infected with COVID-19 and have the ability to transmit the virus. The most common symptoms include: fever, fatigue, cough, body aches, headaches, sore throat, nasal congestion, shortness of breath, nausea, vomiting, diarrhea, changes in smell and/or taste.    COURSE OF ILLNESS Some patients may  begin with mild disease which can progress quickly into critical symptoms. If your symptoms are worsening please call ahead to the Emergency Department and proceed there for further treatment. Recovery time appears to be roughly 1-2 weeks for mild symptoms and 3-6 weeks for severe disease.   GO IMMEDIATELY TO ER FOR FEVER YOU ARE UNABLE TO GET DOWN WITH TYLENOL, BREATHING PROBLEMS, CHEST PAIN, FATIGUE, LETHARGY, INABILITY TO EAT OR DRINK, ETC  QUARANTINE AND ISOLATION: To help decrease the spread of COVID-19 please remain isolated if you have COVID infection or are highly suspected to have COVID infection. This means -stay home and isolate to one room in the home if you live with others. Do not share a bed or bathroom with others while ill, sanitize and wipe down all countertops and keep common areas clean and disinfected. Stay home for 5 days. If you have no symptoms or your symptoms are resolving after 5 days, you can leave your house. Continue to wear a mask around others for 5 additional days. If you have been in close contact (within 6 feet) of someone diagnosed with COVID 19, you are advised to quarantine in your home for 14 days as symptoms can develop anywhere from 2-14 days after exposure to the virus. If you develop symptoms, you  must isolate.  Most current guidelines for COVID after exposure -unvaccinated: isolate 5 days and strict mask use x 5 days. Test on day 5 is possible -vaccinated: wear mask x 10 days if symptoms do not develop -You do not necessarily need to be tested for COVID if you have + exposure and  develop symptoms.  Just isolate at home x10 days from symptom onset During this global pandemic, CDC advises to practice social distancing, try to stay at least 44ft away from others at all times. Wear a face covering. Wash and sanitize your hands regularly and avoid going anywhere that is not necessary.  KEEP IN MIND THAT THE COVID TEST IS NOT 100% ACCURATE AND YOU SHOULD STILL DO  EVERYTHING TO PREVENT POTENTIAL SPREAD OF VIRUS TO OTHERS (WEAR MASK, WEAR GLOVES, WASH HANDS AND SANITIZE REGULARLY). IF INITIAL TEST IS NEGATIVE, THIS MAY NOT MEAN YOU ARE DEFINITELY NEGATIVE. MOST ACCURATE TESTING IS DONE 5-7 DAYS AFTER EXPOSURE.   It is not advised by CDC to get re-tested after receiving a positive COVID test since you can still test positive for weeks to months after you have already cleared the virus.   *If you have not been vaccinated for COVID, I strongly suggest you consider getting vaccinated as long as there are no contraindications.

## 2020-04-09 NOTE — ED Provider Notes (Signed)
[facility]    CSN: [id number] Arrival date & time: 04/09/20  0804      History   Chief Complaint Chief Complaint  Patient presents with  . Cough  . Generalized Body Aches    HPI Carol Best is a 50 y.o. female presenting for onset of body aches, fatigue, nasal congestion, sore throat and cough yesterday. Patient says her 52 year old daughter has COVID and lives with her.  Patient says she is fully vaccinated.  Patient denies any fevers.  She does admit to occasional shortness of breath, but states that she is a longtime smoker and that happens occasionally.  She has that the cough is sometimes productive.  Denies any associated pain or chest or weakness.  Patient denies any history of COPD or emphysema or any other cardiopulmonary disease.  She does have history of hypertension and states she has not taken her blood pressure medication today.  Patient states that she has had a really bad cough that is worse at night and she would like something for the cough.  She has no other concerns today.  HPI  Past Medical History:  Diagnosis Date  . Hiatal hernia with GERD    Patient has sx to remove  . Hypertension   . Urinary tract infection     Patient Active Problem List   Diagnosis Date Noted  . Shoulder pain 05/26/2017  . Full thickness rotator cuff tear 05/26/2017  . Bursitis of shoulder 05/26/2017  . Essential hypertension 12/29/2015  . Chronic midline low back pain without sciatica 12/29/2015    Past Surgical History:  Procedure Laterality Date  . ABDOMINAL HYSTERECTOMY    . CHOLECYSTECTOMY    . SHOULDER ARTHROSCOPY WITH ROTATOR CUFF REPAIR      OB History   No obstetric history on file.      Home Medications    Prior to Admission medications   Medication Sig Start Date End Date Taking? Authorizing Provider  guaifenesin (ROBITUSSIN) 100 MG/5ML syrup Take 5-10 mLs (100-200 mg total) by mouth every 4 (four) hours as needed for up to 7 days for  cough. 04/09/20 04/16/20 Yes Shirlee LatchEaves, Ashima Shrake B, PA-C  promethazine-dextromethorphan (PROMETHAZINE-DM) 6.25-15 MG/5ML syrup Take 5 mLs by mouth at bedtime for 10 days. 04/09/20 04/19/20 Yes Shirlee LatchEaves, Elisea Khader B, PA-C  albuterol (VENTOLIN HFA) 108 (90 Base) MCG/ACT inhaler Inhale 1-2 puffs into the lungs every 6 (six) hours as needed for wheezing or shortness of breath. 01/25/19   Payton Mccallumonty, [place], MD  amLODipine (NORVASC) 5 MG tablet Take 25 mg by mouth daily.     [provider]  atenolol (TENORMIN) 25 MG tablet Take 1 tablet (25 mg total) by mouth daily. 12/27/15   Lorre MunroeBaity, Regina W, NP  benzonatate (TESSALON) 200 MG capsule Take 1 capsule (200 mg total) by mouth 3 (three) times daily as needed. 01/25/19   Payton Mccallumonty, [place], MD  diclofenac (VOLTAREN) 75 MG EC tablet Take 1 tablet (75 mg total) by mouth 2 (two) times daily. 03/02/18   Lenn Sinkegal, Norman S, DPM  diclofenac (VOLTAREN) 75 MG EC tablet Take 1 tablet (75 mg total) by mouth 2 (two) times daily. 05/26/18   Lenn Sinkegal, Norman S, DPM  diclofenac sodium (VOLTAREN) 1 % GEL  03/25/18   [provider]  doxycycline (VIBRA-TABS) 100 MG tablet Take 1 tablet (100 mg total) by mouth 2 (two) times daily. 01/25/19   Payton Mccallumonty, [place], MD  fluticasone (FLONASE) 50 MCG/ACT nasal spray fluticasone propionate 50 mcg/actuation nasal spray,suspension  [provider]  hydrochlorothiazide (HYDRODIURIL) 25 MG tablet TAKE 1 TABLET (25 MG TOTAL) BY MOUTH DAILY. 03/27/16   Lorre Munroe, NP  predniSONE (DELTASONE) 10 MG tablet Start 60 mg po day one, then 50 mg po day two, taper by 10 mg daily until complete. 01/25/19   Payton Mccallum, MD  traMADol (ULTRAM) 50 MG tablet Take 1 tablet (50 mg total) by mouth 3 (three) times daily. 05/26/18   Lenn Sink, DPM    Family History Family History  Problem Relation Age of Onset  . Diabetes Brother   . Hypertension Brother   . Hyperlipidemia Brother   . Cancer Neg Hx   . Stroke Neg Hx     Social History Social History    Tobacco Use  . Smoking status: Current Every Day Smoker    Packs/day: 0.50    Years: 20.00    Pack years: 10.00  . Smokeless tobacco: Never Used  Substance Use Topics  . Alcohol use: No  . Drug use: No     Allergies   Diclofenac   Review of Systems Review of Systems  Constitutional: Positive for fatigue. Negative for chills, diaphoresis and fever.  HENT: Positive for congestion, rhinorrhea and sore throat. Negative for ear pain, sinus pressure and sinus pain.   Respiratory: Positive for cough and shortness of breath.   Cardiovascular: Negative for chest pain.  Gastrointestinal: Negative for abdominal pain, nausea and vomiting.  Musculoskeletal: Positive for myalgias. Negative for arthralgias.  Skin: Negative for rash.  Neurological: Negative for weakness.  Hematological: Negative for adenopathy.     Physical Exam Triage Vital Signs ED Triage Vitals  Enc Vitals Group     BP 04/09/20 0816 (!) 170/110     Pulse Rate 04/09/20 0816 77     Resp 04/09/20 0816 18     Temp 04/09/20 0816 99.1 F (37.3 C)     Temp src --      SpO2 04/09/20 0816 99 %     Weight --      Height --      Head Circumference --      Peak Flow --      Pain Score 04/09/20 0818 5     Pain Loc --      Pain Edu? --      Excl. in GC? --    No data found.  Updated Vital Signs BP (!) 165/105 (BP Location: Right Arm)   Pulse 77   Temp 99.1 F (37.3 C)   Resp 18   SpO2 99%   Physical Exam Vitals and nursing note reviewed.  Constitutional:      General: She is not in acute distress.    Appearance: Normal appearance. She is not ill-appearing or toxic-appearing.  HENT:     Head: Normocephalic and atraumatic.     Nose: Congestion and rhinorrhea present.     Mouth/Throat:     Mouth: Mucous membranes are moist.     Pharynx: Oropharynx is clear.  Eyes:     General: No scleral icterus.       Right eye: No discharge.        Left eye: No discharge.     Conjunctiva/sclera: Conjunctivae normal.   Cardiovascular:     Rate and Rhythm: Normal rate and regular rhythm.     Heart sounds: Normal heart sounds.  Pulmonary:     Effort: Pulmonary effort is normal. No respiratory distress.     Breath sounds: Rhonchi (few  scattered rhonchi which mostly clear with cough) present. No wheezing or rales.  Musculoskeletal:     Cervical back: Neck supple.  Skin:    General: Skin is dry.  Neurological:     General: No focal deficit present.     Mental Status: She is alert. Mental status is at baseline.     Motor: No weakness.     Gait: Gait normal.  Psychiatric:        Mood and Affect: Mood normal.        Behavior: Behavior normal.        Thought Content: Thought content normal.      UC Treatments / Results  Labs (all labs ordered are listed, but only abnormal results are displayed) Labs Reviewed  SARS CORONAVIRUS 2 (TAT 6-24 HRS)    EKG   Radiology No results found.  Procedures Procedures (including critical care time)  Medications Ordered in UC Medications - No data to display  Initial Impression / Assessment and Plan / UC Course  I have reviewed the triage vital signs and the nursing notes.  Pertinent labs & imaging results that were available during my care of the patient were reviewed by me and considered in my medical decision making (see chart for details).   Exam and symptoms consistent with viral bronchitis, likely due to COVID-19 given her recent exposure.  Advised supportive care with increasing rest and fluids.  Current CDC guidelines, isolation protocol and ED precautions reviewed with patient in the event she test positive for COVID-19.  Patient did request stronger cough medication, but after checking the controlled substance database, I see that she receives oxycodone monthly.  Advised taking Mucinex during the day and Promethazine DM at nighttime.  Advised for her to follow-up with our clinic as needed for any new or worsening symptoms, especially if she develops  a fever or any increased shortness of breath.  She does have an inhaler at home and advised her to use that if she feels short of breath.  Patient agreeable.  Final Clinical Impressions(s) / UC Diagnoses   Final diagnoses:  Viral illness  Cough  Body aches  Exposure to COVID-19 virus  Essential hypertension     Discharge Instructions     Take Mucinex during the day and promethazine DM at bed time. Cannot give you narcotic containing cough syrups since you take oxycodone already.  You have received COVID testing today either for positive exposure, concerning symptoms that could be related to COVID infection, screening purposes, or re-testing after confirmed positive.  Your test obtained today checks for active viral infection in the last 1-2 weeks. If your test is negative now, you can still test positive later. So, if you do develop symptoms you should either get re-tested and/or isolate x 5 days and then strict mask use x 5 days (unvaccinated) or mask use x 10 days (vaccinated). Please follow CDC guidelines.  While Rapid antigen tests come back in 15-20 minutes, send out PCR/molecular test results typically come back within 1-3 days. In the mean time, if you are symptomatic, assume this could be a positive test and treat/monitor yourself as if you do have COVID.   We will call with test results if positive. Please download the MyChart app and set up a profile to access test results.   If symptomatic, go home and rest. Push fluids. Take Tylenol as needed for discomfort. Gargle warm salt water. Throat lozenges. Take Mucinex DM or Robitussin for cough. Humidifier in bedroom  to ease coughing. Warm showers. Also review the COVID handout for more information.  COVID-19 INFECTION: The incubation period of COVID-19 is approximately 14 days after exposure, with most symptoms developing in roughly 4-5 days. Symptoms may range in severity from mild to critically severe. Roughly 80% of those infected  will have mild symptoms. People of any age may become infected with COVID-19 and have the ability to transmit the virus. The most common symptoms include: fever, fatigue, cough, body aches, headaches, sore throat, nasal congestion, shortness of breath, nausea, vomiting, diarrhea, changes in smell and/or taste.    COURSE OF ILLNESS Some patients may begin with mild disease which can progress quickly into critical symptoms. If your symptoms are worsening please call ahead to the Emergency Department and proceed there for further treatment. Recovery time appears to be roughly 1-2 weeks for mild symptoms and 3-6 weeks for severe disease.   GO IMMEDIATELY TO ER FOR FEVER YOU ARE UNABLE TO GET DOWN WITH TYLENOL, BREATHING PROBLEMS, CHEST PAIN, FATIGUE, LETHARGY, INABILITY TO EAT OR DRINK, ETC  QUARANTINE AND ISOLATION: To help decrease the spread of COVID-19 please remain isolated if you have COVID infection or are highly suspected to have COVID infection. This means -stay home and isolate to one room in the home if you live with others. Do not share a bed or bathroom with others while ill, sanitize and wipe down all countertops and keep common areas clean and disinfected. Stay home for 5 days. If you have no symptoms or your symptoms are resolving after 5 days, you can leave your house. Continue to wear a mask around others for 5 additional days. If you have been in close contact (within 6 feet) of someone diagnosed with COVID 19, you are advised to quarantine in your home for 14 days as symptoms can develop anywhere from 2-14 days after exposure to the virus. If you develop symptoms, you  must isolate.  Most current guidelines for COVID after exposure -unvaccinated: isolate 5 days and strict mask use x 5 days. Test on day 5 is possible -vaccinated: wear mask x 10 days if symptoms do not develop -You do not necessarily need to be tested for COVID if you have + exposure and  develop symptoms. Just isolate at  home x10 days from symptom onset During this global pandemic, CDC advises to practice social distancing, try to stay at least 37ft away from others at all times. Wear a face covering. Wash and sanitize your hands regularly and avoid going anywhere that is not necessary.  KEEP IN MIND THAT THE COVID TEST IS NOT 100% ACCURATE AND YOU SHOULD STILL DO EVERYTHING TO PREVENT POTENTIAL SPREAD OF VIRUS TO OTHERS (WEAR MASK, WEAR GLOVES, WASH HANDS AND SANITIZE REGULARLY). IF INITIAL TEST IS NEGATIVE, THIS MAY NOT MEAN YOU ARE DEFINITELY NEGATIVE. MOST ACCURATE TESTING IS DONE 5-7 DAYS AFTER EXPOSURE.   It is not advised by CDC to get re-tested after receiving a positive COVID test since you can still test positive for weeks to months after you have already cleared the virus.   *If you have not been vaccinated for COVID, I strongly suggest you consider getting vaccinated as long as there are no contraindications.      ED Prescriptions    Medication Sig Dispense Auth. Provider   promethazine-dextromethorphan (PROMETHAZINE-DM) 6.25-15 MG/5ML syrup Take 5 mLs by mouth at bedtime for 10 days. 50 mL Eusebio Friendly B, PA-C   guaifenesin (ROBITUSSIN) 100 MG/5ML syrup Take 5-10 mLs (100-200  mg total) by mouth every 4 (four) hours as needed for up to 7 days for cough. 118 mL Shirlee LatchEaves, Preciosa Bundrick B, PA-C     I have reviewed the PDMP during this encounter.   Shirlee Latchaves, Kadeem Hyle B, PA-C 04/09/20 [phone]

## 2020-04-09 NOTE — ED Triage Notes (Signed)
Pt presents with c/o cough, body aches that began yesterday, daughter is covid positive

## 2020-04-10 LAB — SARS CORONAVIRUS 2 (TAT 6-24 HRS): SARS Coronavirus 2: POSITIVE — AB

## 2021-04-01 ENCOUNTER — Ambulatory Visit
Admission: EM | Admit: 2021-04-01 | Discharge: 2021-04-01 | Disposition: A | Discharge status: Home / Self Care | Payer: 59 | Attending: Internal Medicine | Admitting: Internal Medicine

## 2021-04-01 ENCOUNTER — Other Ambulatory Visit: Payer: Self-pay

## 2021-04-01 ENCOUNTER — Encounter: Payer: Self-pay | Admitting: Internal Medicine

## 2021-04-01 DIAGNOSIS — Z20822 Contact with and (suspected) exposure to covid-19: Secondary | ICD-10-CM | POA: Diagnosis not present

## 2021-04-01 DIAGNOSIS — B9689 Other specified bacterial agents as the cause of diseases classified elsewhere: Secondary | ICD-10-CM | POA: Diagnosis not present

## 2021-04-01 DIAGNOSIS — J019 Acute sinusitis, unspecified: Secondary | ICD-10-CM | POA: Diagnosis not present

## 2021-04-01 DIAGNOSIS — H9209 Otalgia, unspecified ear: Secondary | ICD-10-CM | POA: Diagnosis present

## 2021-04-01 LAB — RESP PANEL BY RT-PCR (FLU A&B, COVID) ARPGX2
Influenza A by PCR: NEGATIVE
Influenza B by PCR: NEGATIVE
SARS Coronavirus 2 by RT PCR: NEGATIVE

## 2021-04-01 MED ORDER — LEVOFLOXACIN 500 MG PO TABS: 500.0000 mg | ORAL_TABLET | Freq: Every day | ORAL | 0 refills | Status: AC

## 2021-04-01 MED ORDER — PREDNISONE 50 MG PO TABS: 50.0000 mg | ORAL_TABLET | Freq: Every day | ORAL | 0 refills | Status: AC

## 2021-04-01 NOTE — ED Provider Notes (Signed)
[facility]    CSN: [id number] Arrival date & time: 04/01/21  1100      History   Chief Complaint Chief Complaint  Patient presents with   Ear Pain   Facial Pain    HPI Carol Best is a 51 y.o. female.  She presents today with 4 days history of face pain, sinus pressure, malaise.  Chills, earache.  Some cough.  Some runny nose.  Has been taking Tylenol and Zyrtec.  No fever, not vomiting, no diarrhea.  Says she gets a sinus infection every year and requires Levaquin to resolve.  HPI  Past Medical History:  Diagnosis Date   Hiatal hernia with GERD    Patient has sx to remove   Hypertension    Urinary tract infection     Patient Active Problem List   Diagnosis Date Noted   Shoulder pain 05/26/2017   Full thickness rotator cuff tear 05/26/2017   Bursitis of shoulder 05/26/2017   Essential hypertension 12/29/2015   Chronic midline low back pain without sciatica 12/29/2015    Past Surgical History:  Procedure Laterality Date   ABDOMINAL HYSTERECTOMY     CHOLECYSTECTOMY     SHOULDER ARTHROSCOPY WITH ROTATOR CUFF REPAIR       Home Medications    Prior to Admission medications   Medication Sig Start Date End Date Taking? Authorizing Provider  atenolol (TENORMIN) 25 MG tablet Take 1 tablet (25 mg total) by mouth daily. 12/27/15  Yes Baity, Carol Oxfordegina W, NP  fluticasone (FLONASE) 50 MCG/ACT nasal spray fluticasone propionate 50 mcg/actuation nasal spray,suspension   Yes [provider]  levofloxacin (LEVAQUIN) 500 MG tablet Take 1 tablet (500 mg total) by mouth daily. 04/01/21  Yes Carol Best Wilson, MD  losartan-hydrochlorothiazide (HYZAAR) 100-25 MG tablet Take 1 tablet by mouth daily. 03/08/21  Yes [provider]  predniSONE (DELTASONE) 50 MG tablet Take 1 tablet (50 mg total) by mouth daily. 04/01/21  Yes Carol RankinMurray, Carol Fleites Wilson, MD    Family History Family History  Problem Relation Age of Onset   Diabetes Brother    Hypertension  Brother    Hyperlipidemia Brother    Cancer Neg Hx    Stroke Neg Hx     Social History Social History   Tobacco Use   Smoking status: Every Day    Packs/day: 0.50    Years: 20.00    Pack years: 10.00    Types: Cigarettes   Smokeless tobacco: Never  Vaping Use   Vaping Use: Never used  Substance Use Topics   Alcohol use: No   Drug use: No     Allergies   Diclofenac   Review of Systems Review of Systems see HPI   Physical Exam Triage Vital Signs ED Triage Vitals  Enc Vitals Group     BP 04/01/21 1157 (!) 147/107     Pulse Rate 04/01/21 1157 92     Resp 04/01/21 1157 18     Temp 04/01/21 1157 98.6 F (37 C)     Temp Source 04/01/21 1157 Oral     SpO2 04/01/21 1157 99 %     Weight 04/01/21 1153 204 lb (92.5 kg)     Height 04/01/21 1153 5\' 8"  (1.727 m)     Pain Score 04/01/21 1153 4     Pain Loc --    Updated Vital Signs BP (!) 147/107 (BP Location: Left Arm)    Pulse 92    Temp 98.6 F (37 C) (Oral)  Resp 18    Ht 5\' 8"  (1.727 m)    Wt 92.5 kg    SpO2 99%    BMI 31.02 kg/m   Physical Exam Constitutional:      General: She is not in acute distress.    Appearance: She is ill-appearing. She is not toxic-appearing.     Comments: Nicely groomed  HENT:     Head: Atraumatic.     Comments: Bilateral TMs are dull, no erythema Marked nasal congestion bilaterally Posterior pharynx is red with somewhat prominent tonsils, no exudate    Mouth/Throat:     Mouth: Mucous membranes are moist.  Eyes:     Conjunctiva/sclera:     Right eye: Right conjunctiva is not injected. No exudate.    Left eye: Left conjunctiva is not injected. No exudate.    Comments: Conjugate gaze observed  Cardiovascular:     Rate and Rhythm: Normal rate and regular rhythm.  Pulmonary:     Effort: Pulmonary effort is normal. No respiratory distress.     Breath sounds: No wheezing or rhonchi.  Abdominal:     General: There is no distension.  Musculoskeletal:     Cervical back: Neck  supple.     Comments: Walked into the urgent care independently  Skin:    General: Skin is warm and dry.     Comments: No cyanosis  Neurological:     Mental Status: She is alert.     Comments: Face symmetric, speech clear, coherent, logical     UC Treatments / Results  Labs (all labs ordered are listed, but only abnormal results are displayed) Labs Reviewed  RESP PANEL BY RT-PCR (FLU A&B, COVID) [id number]  Covid and flu A&B tests were negative  EKG N/A  Radiology No results found. N/A  Procedures Procedures (including critical care time) N/A  Medications Ordered in UC Medications - No data to display N/A  Initial Impression / Assessment and Plan / UC Course  Severe congestion on exam with severe symptoms although no fever/tachycardia.  History of annual recurrence of symptoms.  Differential dx includes viral infection with flu, covid, as well as acute recurrent bacterial sinusitis.  Predominant symptoms reflect sinus involvement with history of recurrence so went with bacterial sinusitis. After test results available, sent rx levaquin and prednisone to pharmacy and notified patient per mychart.    Final Clinical Impressions(s) / UC Diagnoses   Final diagnoses:  Acute bacterial sinusitis     Discharge Instructions      Symptoms today seem most consistent with a viral respiratory infection.  This could include covid.  A covid/flu test is pending.  You will be contacted about appropriate treatment, based on test results (should be available in a couple hours).  Push fluids and rest.  Take tylenol or advil otc as needed for fever, discomfort.  Eat fruits and vegetables to help your immune system do its Best work.  Anticipate gradual improvement over the next several days.  Recheck for new fever >100.5, increasing phlegm production/nasal discharge, or if not starting to improve in a few days.       ED Prescriptions     Medication Sig Dispense Auth. Provider   predniSONE  (DELTASONE) 50 MG tablet Take 1 tablet (50 mg total) by mouth daily. 3 tablet , MD   levofloxacin (LEVAQUIN) 500 MG tablet Take 1 tablet (500 mg total) by mouth daily. 5 tablet Carol Rankin, MD      PDMP not reviewed this  encounter.   Carol Rankin, MD 04/02/21 1048

## 2021-04-01 NOTE — Discharge Instructions (Addendum)
Symptoms today seem most consistent with a viral respiratory infection.  This could include covid.  A covid/flu test is pending.  You will be contacted about appropriate treatment, based on test results (should be available in a couple hours).  Push fluids and rest.  Take tylenol or advil otc as needed for fever, discomfort.  Eat fruits and vegetables to help your immune system do its best work.  Anticipate gradual improvement over the next several days.  Recheck for new fever >100.5, increasing phlegm production/nasal discharge, or if not starting to improve in a few days.

## 2021-04-01 NOTE — ED Triage Notes (Signed)
Pt c/o body chills, sinus pain, congestion, ear pain, body aches, cough. [id number]. Pt has not been tested for covid. Pt has taken Tylenol and zyrtec this morning at 5:30am.

## 2021-04-08 ENCOUNTER — Telehealth: Payer: Self-pay | Admitting: Internal Medicine

## 2021-04-08 NOTE — Telephone Encounter (Signed)
Received a call from [place] stating that she was having ongoing symptoms and she had questions. Left a message to call back.

## 2021-04-08 NOTE — Telephone Encounter (Signed)
Carol Best returned the call stating that she was seen on Tuesday and was diagnosed with an ear infection on both ears. Pt states that she had finished her medication and that today her ears have started to hurt like before. Pt was informed that with her symptoms returning after completing her medication, she would have to be re-evaluated to receive more medication. Pt verbalized understanding and states that she would come back in to have her ears checked.

## 2023-04-25 ENCOUNTER — Encounter (HOSPITAL_BASED_OUTPATIENT_CLINIC_OR_DEPARTMENT_OTHER): Payer: Self-pay | Admitting: Emergency Medicine

## 2023-04-25 ENCOUNTER — Emergency Department (HOSPITAL_BASED_OUTPATIENT_CLINIC_OR_DEPARTMENT_OTHER): Payer: 59

## 2023-04-25 ENCOUNTER — Encounter (HOSPITAL_COMMUNITY): Payer: Self-pay

## 2023-04-25 ENCOUNTER — Emergency Department (HOSPITAL_BASED_OUTPATIENT_CLINIC_OR_DEPARTMENT_OTHER)
Admission: EM | Admit: 2023-04-25 | Discharge: 2023-04-25 | Disposition: A | Discharge status: Home / Self Care | Payer: 59 | Source: Home / Self Care | Attending: Emergency Medicine | Admitting: Emergency Medicine

## 2023-04-25 ENCOUNTER — Emergency Department (HOSPITAL_COMMUNITY)
Admission: EM | Admit: 2023-04-25 | Discharge: 2023-04-25 | Disposition: A | Discharge status: Against Medical Advice | Payer: 59 | Attending: Emergency Medicine | Admitting: Emergency Medicine

## 2023-04-25 ENCOUNTER — Other Ambulatory Visit: Payer: Self-pay

## 2023-04-25 DIAGNOSIS — I10 Essential (primary) hypertension: Secondary | ICD-10-CM | POA: Insufficient documentation

## 2023-04-25 DIAGNOSIS — K5900 Constipation, unspecified: Secondary | ICD-10-CM | POA: Insufficient documentation

## 2023-04-25 DIAGNOSIS — R3911 Hesitancy of micturition: Secondary | ICD-10-CM | POA: Insufficient documentation

## 2023-04-25 DIAGNOSIS — R109 Unspecified abdominal pain: Secondary | ICD-10-CM | POA: Insufficient documentation

## 2023-04-25 DIAGNOSIS — Z5321 Procedure and treatment not carried out due to patient leaving prior to being seen by health care provider: Secondary | ICD-10-CM | POA: Diagnosis not present

## 2023-04-25 LAB — COMPREHENSIVE METABOLIC PANEL
ALT: 12 U/L (ref 0–44)
AST: 25 U/L (ref 15–41)
Albumin: 3.9 g/dL (ref 3.5–5.0)
Alkaline Phosphatase: 70 U/L (ref 38–126)
Anion gap: 12 (ref 5–15)
BUN: 10 mg/dL (ref 6–20)
CO2: 29 mmol/L (ref 22–32)
Calcium: 9.1 mg/dL (ref 8.9–10.3)
Chloride: 101 mmol/L (ref 98–111)
Creatinine, Ser: 0.72 mg/dL (ref 0.44–1.00)
GFR, Estimated: >60 mL/min (ref >60)
Glucose, Bld: 101 mg/dL — ABNORMAL HIGH (ref 70–99)
Potassium: 4.5 mmol/L (ref 3.5–5.1)
Sodium: 142 mmol/L (ref 135–145)
Total Bilirubin: 1.1 mg/dL (ref 0.0–1.2)
Total Protein: 6.6 g/dL (ref 6.5–8.1)

## 2023-04-25 LAB — CBC
HCT: 43.9 % (ref 36.0–46.0)
Hemoglobin: 14.4 g/dL (ref 12.0–15.0)
MCH: 28.9 pg (ref 26.0–34.0)
MCHC: 32.8 g/dL (ref 30.0–36.0)
MCV: 88.2 fL (ref 80.0–100.0)
Platelets: 296 10*3/uL (ref 150–400)
RBC: 4.98 MIL/uL (ref 3.87–5.11)
RDW: 14.2 % (ref 11.5–15.5)
WBC: 9.2 10*3/uL (ref 4.0–10.5)
nRBC: 0 % (ref 0.0–0.2)

## 2023-04-25 LAB — URINALYSIS, ROUTINE W REFLEX MICROSCOPIC
Bilirubin Urine: NEGATIVE
Glucose, UA: NEGATIVE mg/dL
Hgb urine dipstick: NEGATIVE
Ketones, ur: NEGATIVE mg/dL
Leukocytes,Ua: NEGATIVE
Nitrite: NEGATIVE
Protein, ur: NEGATIVE mg/dL
Specific Gravity, Urine: 1.01 (ref 1.005–1.030)
pH: 7 (ref 5.0–8.0)

## 2023-04-25 LAB — LIPASE, BLOOD: Lipase: 42 U/L (ref 11–51)

## 2023-04-25 MED ORDER — METHOCARBAMOL 500 MG PO TABS: 500.0000 mg | ORAL_TABLET | Freq: Three times a day (TID) | ORAL | 0 refills | Status: AC | PRN

## 2023-04-25 MED ORDER — OXYCODONE-ACETAMINOPHEN 5-325 MG PO TABS
1.0000 | ORAL_TABLET | ORAL | Status: DC | PRN
Start: 2023-04-25 — End: 2023-04-25
  Administered 2023-04-25: 1 via ORAL
  Filled 2023-04-25: qty 1

## 2023-04-25 MED ORDER — MORPHINE SULFATE (PF) 4 MG/ML IV SOLN
4.0000 mg | Freq: Once | INTRAVENOUS | Status: AC
Start: 2023-04-25 — End: 2023-04-25
  Administered 2023-04-25: 4 mg via INTRAVENOUS
  Filled 2023-04-25: qty 1

## 2023-04-25 MED ORDER — ONDANSETRON HCL 4 MG/2ML IJ SOLN
4.0000 mg | Freq: Once | INTRAMUSCULAR | Status: AC
  Administered 2023-04-25: 4 mg via INTRAVENOUS
  Filled 2023-04-25: qty 2

## 2023-04-25 MED ORDER — LIDOCAINE 5 % EX OINT: 1.0000 | TOPICAL_OINTMENT | CUTANEOUS | 0 refills | Status: AC | PRN

## 2023-04-25 MED ORDER — IOHEXOL 300 MG/ML  SOLN
100.0000 mL | Freq: Once | INTRAMUSCULAR | Status: AC | PRN
Start: 2023-04-25 — End: 2023-04-25
  Administered 2023-04-25: 100 mL via INTRAVENOUS

## 2023-04-25 NOTE — ED Triage Notes (Signed)
Pt c/o left flank pain radiating into back x 4 days. Pt denies injury. Pt states she does feel some urinary hesitancy. Pt denies nausea, vomiting, or diarrhea. Pt has had constipation.

## 2023-04-25 NOTE — ED Notes (Signed)
Pt name called x1 for room

## 2023-04-25 NOTE — Discharge Instructions (Signed)
You were seen in the emergency room today with abdominal pain.  Your CT scan was reassuring and showed no clear cause for your symptoms.  I question if this could be nerve pain from your back.  This could be from slightly pinched nerve but I want you to be on the look out for development of rash in this area.  If you start to develop rash she should be seen either at your primary care doctor, urgent care, or return to the emergency department as this could be the first sign of shingles.  Robaxin is a muscle relaxer and you can take as needed for severe symptoms but it may cause drowsiness and you should not drive a car if you will be taking this.

## 2023-04-25 NOTE — ED Notes (Signed)
 Awaiting patient from lobby

## 2023-04-25 NOTE — ED Triage Notes (Signed)
t c/o left flank pain radiating into back x 4 days. Pt denies injury. Pt states she does feel some urinary hesitancy. Pt denies nausea, vomiting, or diarrhea. Pt has had constipation. She was seen at [facility], had blood drawn and gave urine sample, had to leave due to pain

## 2023-04-25 NOTE — ED Notes (Signed)
 Patient transported to CT

## 2023-04-25 NOTE — ED Provider Notes (Signed)
Emergency Department Provider Note   I have reviewed the triage vital signs and the nursing notes.   HISTORY  Chief Complaint Flank Pain   HPI Carol Best is a 53 y.o. female with past history reviewed below presents emergency department with left flank pain.  Symptoms been intermittent over the past 4 days but became especially severe at 2 AM this morning.  She has some associated urine hesitancy but no dysuria.  No nausea, vomiting, diarrhea.  No fevers.  No similar pain like this in the past.  No history of kidney stones.  She reports pain not responding to home medications initially went to the [facility] emergency department but left after obtaining labs and urine due to Carol Best wait times and increasing pain.   Past Medical History:  Diagnosis Date   Hiatal hernia with GERD    Patient has sx to remove   Hypertension    Urinary tract infection     Review of Systems  Constitutional: No fever/chills Cardiovascular: Denies chest pain. Respiratory: Denies shortness of breath. Gastrointestinal: Positive left flank/abdominal pain.  No nausea, no vomiting.  No diarrhea.  No constipation. Genitourinary: Negative for dysuria. Musculoskeletal: Negative for back pain. Skin: Negative for rash. Neurological: Negative for headaches, focal weakness or numbness.  ____________________________________________   PHYSICAL EXAM:  VITAL SIGNS: ED Triage Vitals [04/25/23 0933]  Encounter Vitals Group     BP (!) 165/113     Pulse Rate 87     Resp 16     Temp 98.6 F (37 C)     Temp Source Oral     SpO2 96 %   Constitutional: Alert and oriented. Well appearing and in no acute distress. Eyes: Conjunctivae are normal.  Head: Atraumatic. Nose: No congestion/rhinnorhea. Mouth/Throat: Mucous membranes are moist. Neck: No stridor.  Cardiovascular: Normal rate, regular rhythm. Good peripheral circulation. Grossly normal heart sounds.   Respiratory: Normal respiratory effort.  No  retractions. Lungs CTAB. Gastrointestinal: Soft with mild left sided tenderness. No distention.  Musculoskeletal: No gross deformities of extremities. Neurologic:  Normal speech and language.  Skin:  Skin is warm, dry and intact. No rash noted. ____________________________________________   PROCEDURES  Procedure(s) performed:   Procedures  None  ____________________________________________   INITIAL IMPRESSION / ASSESSMENT AND PLAN / ED COURSE  Pertinent labs & imaging results that were available during my care of the patient were reviewed by me and considered in my medical decision making (see chart for details).   This patient is Presenting for Evaluation of abdominal pain, which does require a range of treatment options, and is a complaint that involves a high risk of morbidity and mortality.  The Differential Diagnoses includes but is not exclusive to ectopic pregnancy, ovarian cyst, ovarian torsion, acute appendicitis, urinary tract infection, endometriosis, bowel obstruction, hernia, colitis, renal colic, gastroenteritis, volvulus etc.   Critical Interventions-    Medications  morphine (PF) 4 MG/ML injection 4 mg (4 mg Intravenous Given 04/25/23 1009)  ondansetron (ZOFRAN) injection 4 mg (4 mg Intravenous Given 04/25/23 1009)  iohexol (OMNIPAQUE) 300 MG/ML solution 100 mL (100 mLs Intravenous Contrast Given 04/25/23 1050)    Reassessment after intervention:  pain resolved.    Clinical Laboratory Tests Ordered, included reviewed lab work from [facility] earlier this morning.  CMP shows normal LFTs and bilirubin.  No acute kidney injury.  CBC without leukocytosis or anemia.  UA without infection.  Radiologic Tests Ordered, included CT abdomen/pelvis. I independently interpreted the images and agree  with radiology interpretation.   Cardiac Monitor Tracing which shows NSR.    Social Determinants of Health Risk patient is a smoker.   Medical Decision Making: Summary:   Patient presents to the emergency department for evaluation of left flank pain.  Labs from [facility], ED reviewed and reassuring.  I do not appreciate any rash overlying the distribution of pain to strongly suspect shingles.  Plan for CT abdomen pelvis with contrast for further characterization.  No prior history of kidney stones.  Reevaluation with update and discussion with patient. CT reassuring. No acute findings. Pain resolved. Plan for continued supportive care and PCP follow up plan.   Patient's presentation is most consistent with acute presentation with potential threat to life or bodily function.   Disposition: discharge  ____________________________________________  FINAL CLINICAL IMPRESSION(S) / ED DIAGNOSES  Final diagnoses:  Left flank pain     NEW OUTPATIENT MEDICATIONS STARTED DURING THIS VISIT:  Discharge Medication List as of 04/25/2023 11:50 AM     START taking these medications   Details  lidocaine (XYLOCAINE) 5 % ointment Apply 1 Application topically as needed., Starting Sun 04/25/2023, Normal    methocarbamol (ROBAXIN) 500 MG tablet Take 1 tablet (500 mg total) by mouth every 8 (eight) hours as needed for muscle spasms., Starting Sun 04/25/2023, Normal        Note:  This document was prepared using Dragon voice recognition software and may include unintentional dictation errors.  Alona Bene, MD, [facility] Emergency Medicine    Yuritzy Zehring, Arlyss Repress, MD 04/28/23 [phone]

## 2024-01-03 ENCOUNTER — Ambulatory Visit
Admission: EM | Admit: 2024-01-03 | Discharge: 2024-01-03 | Disposition: A | Discharge status: Home / Self Care | Attending: Emergency Medicine | Admitting: Emergency Medicine

## 2024-01-03 DIAGNOSIS — H1032 Unspecified acute conjunctivitis, left eye: Secondary | ICD-10-CM

## 2024-01-03 DIAGNOSIS — I10 Essential (primary) hypertension: Secondary | ICD-10-CM

## 2024-01-03 MED ORDER — ERYTHROMYCIN 5 MG/GM OP OINT: TOPICAL_OINTMENT | OPHTHALMIC | 0 refills | Status: AC

## 2024-01-03 NOTE — ED Triage Notes (Signed)
 Patient to Urgent Care with complaints of itchy /watery/ swollen left eye with yellow discharge.  Symptoms x4 days (woke up w/ symptoms).  Cleaning area w/ baby shampoo/ hot compress/ hot tea bag.

## 2024-01-03 NOTE — Discharge Instructions (Addendum)
 Use the antibiotic eyedrops as prescribed.  Follow-up with your eye care provider.  Go to the emergency department if you have acute eye pain, changes in your vision, or other concerning symptoms.    Your blood pressure is elevated today at 156/98; repeat 149/90.  Please have this rechecked by your primary care provider in 2-4 weeks.

## 2024-01-03 NOTE — ED Provider Notes (Signed)
 [facility]    CSN: [id number] Arrival date & time: 01/03/24  9185      History   Chief Complaint Chief Complaint  Patient presents with   Eye Problem    HPI Carol Best is a 53 y.o. female.  Patient presents with 4 days of left eye redness, irritation, itching, swelling, yellow drainage.  Treatment attempted with warm compresses.  No fever, change in vision, eye trauma.  Patient wears glasses.  The history is provided by the patient and medical records.    Past Medical History:  Diagnosis Date   Hiatal hernia with GERD    Patient has sx to remove   Hypertension    Urinary tract infection     Patient Active Problem List   Diagnosis Date Noted   Shoulder pain 05/26/2017   Full thickness rotator cuff tear 05/26/2017   Bursitis of shoulder 05/26/2017   Essential hypertension 12/29/2015   Chronic midline low back pain without sciatica 12/29/2015    Past Surgical History:  Procedure Laterality Date   ABDOMINAL HYSTERECTOMY     CHOLECYSTECTOMY     SHOULDER ARTHROSCOPY WITH ROTATOR CUFF REPAIR      OB History   No obstetric history on file.      Home Medications    Prior to Admission medications   Medication Sig Start Date End Date Taking? Authorizing Provider  erythromycin ophthalmic ointment Place a 1/2 inch ribbon of ointment into the lower eyelid four times a day for 7 days. 01/03/24  Yes Corlis Burnard DEL, NP  HYDROcodone -acetaminophen  (NORCO) 5-325 MG tablet Take 1 tablet every 3-4 hours by oral route as needed. 01/01/14  Yes [provider]  WEGOVY 0.5 MG/0.5ML SOAJ SQ injection SMARTSIG:1 pre-filled pen syringe Once a Week 12/22/23  Yes [provider]  atenolol  (TENORMIN ) 25 MG tablet Take 1 tablet (25 mg total) by mouth daily. Patient not taking: Reported on 01/03/2024 12/27/15   Antonette Angeline ORN, NP  fluticasone [facility]) 50 MCG/ACT nasal spray fluticasone propionate 50 mcg/actuation nasal spray,suspension    [provider]  levofloxacin  (LEVAQUIN ) 500 MG tablet Take 1 tablet (500 mg total) by mouth daily. Patient not taking: Reported on 01/03/2024 04/01/21   Jason Leita Blush, MD  lidocaine  (XYLOCAINE ) 5 % ointment Apply 1 Application topically as needed. 04/25/23   Long, Fonda MATSU, MD  losartan-hydrochlorothiazide  (HYZAAR) 100-25 MG tablet Take 1 tablet by mouth daily. 03/08/21   [provider]  methocarbamol  (ROBAXIN ) 500 MG tablet Take 1 tablet (500 mg total) by mouth every 8 (eight) hours as needed for muscle spasms. Patient not taking: Reported on 01/03/2024 04/25/23   Darra Fonda MATSU, MD  predniSONE  (DELTASONE ) 50 MG tablet Take 1 tablet (50 mg total) by mouth daily. Patient not taking: Reported on 01/03/2024 04/01/21   Jason Leita Blush, MD    Family History Family History  Problem Relation Age of Onset   Diabetes Brother    Hypertension Brother    Hyperlipidemia Brother    Cancer Neg Hx    Stroke Neg Hx     Social History Social History   Tobacco Use   Smoking status: Every Day    Current packs/day: 0.50    Average packs/day: 0.5 packs/day for 20.0 years (10.0 ttl pk-yrs)    Types: Cigarettes   Smokeless tobacco: Never  Vaping Use   Vaping status: Never Used  Substance Use Topics   Alcohol use: No   Drug use: No  Allergies   Diclofenac    Review of Systems Review of Systems  Constitutional:  Negative for chills and fever.  Eyes:  Positive for pain, discharge, redness and itching. Negative for visual disturbance.     Physical Exam Triage Vital Signs ED Triage Vitals  Encounter Vitals Group     BP      Girls Systolic BP Percentile      Girls Diastolic BP Percentile      Boys Systolic BP Percentile      Boys Diastolic BP Percentile      Pulse      Resp      Temp      Temp src      SpO2      Weight      Height      Head Circumference      Peak Flow      Pain Score      Pain Loc      Pain Education      Exclude from Growth Chart    No  data found.  Updated Vital Signs BP (!) 149/90   Pulse 92   Temp 97.8 F (36.6 C)   Resp 18   SpO2 98%   Visual Acuity Right Eye Distance: 20/16 Left Eye Distance: 20/20 Bilateral Distance: 20/16  Right Eye Near:   Left Eye Near:    Bilateral Near:     Physical Exam Constitutional:      General: She is not in acute distress. HENT:     Mouth/Throat:     Mouth: Mucous membranes are moist.  Eyes:     General: Vision grossly intact.        Right eye: No discharge.        Left eye: No discharge.     Extraocular Movements: Extraocular movements intact.     Conjunctiva/sclera:     Left eye: Left conjunctiva is injected.     Pupils: Pupils are equal, round, and reactive to light.     Comments: Left eye mildly injected with slight swelling of upper and lower eyelids.  Cardiovascular:     Rate and Rhythm: Normal rate and regular rhythm.     Heart sounds: Normal heart sounds.  Pulmonary:     Effort: Pulmonary effort is normal. No respiratory distress.     Breath sounds: Normal breath sounds.  Neurological:     Mental Status: She is alert.      UC Treatments / Results  Labs (all labs ordered are listed, but only abnormal results are displayed) Labs Reviewed - No data to display  EKG   Radiology No results found.  Procedures Procedures (including critical care time)  Medications Ordered in UC Medications - No data to display  Initial Impression / Assessment and Plan / UC Course  I have reviewed the triage vital signs and the nursing notes.  Pertinent labs & imaging results that were available during my care of the patient were reviewed by me and considered in my medical decision making (see chart for details).    Left eye bacterial conjunctivitis, elevated blood pressure reading with hypertension.  Treating with erythromycin eye ointment.  Education provided on conjunctivitis.  Instructed patient to follow-up with her eye care provider.  ED precautions  discussed. Also discussed with patient that her blood pressure is elevated today and needs to be rechecked by PCP in 2 to 4 weeks.  Education provided on managing hypertension.  Patient agrees to plan of care.  Final Clinical Impressions(s) / UC Diagnoses   Final diagnoses:  Acute bacterial conjunctivitis of left eye  Elevated blood pressure reading in office with diagnosis of hypertension     Discharge Instructions      Use the antibiotic eyedrops as prescribed.  Follow-up with your eye care provider.  Go to the emergency department if you have acute eye pain, changes in your vision, or other concerning symptoms.    Your blood pressure is elevated today at 156/98; repeat 149/90.  Please have this rechecked by your primary care provider in 2-4 weeks.          ED Prescriptions     Medication Sig Dispense Auth. Provider   erythromycin ophthalmic ointment Place a 1/2 inch ribbon of ointment into the lower eyelid four times a day for 7 days. 3.5 g Corlis Burnard DEL, NP      PDMP not reviewed this encounter.   Corlis Burnard DEL, NP 01/03/24 [phone]
